# Patient Record
Sex: Male | Born: 1950 | Race: White | Hispanic: No | Marital: Married | Smoking: Never smoker
Health system: Southern US, Community
[De-identification: ages and names within clinical notes are randomized; demographics above are authoritative.]

## PROBLEM LIST (undated history)

## (undated) DIAGNOSIS — R569 Unspecified convulsions: Secondary | ICD-10-CM

## (undated) DIAGNOSIS — I10 Essential (primary) hypertension: Secondary | ICD-10-CM

## (undated) DIAGNOSIS — R011 Cardiac murmur, unspecified: Secondary | ICD-10-CM

## (undated) DIAGNOSIS — R7303 Prediabetes: Secondary | ICD-10-CM

## (undated) DIAGNOSIS — E785 Hyperlipidemia, unspecified: Secondary | ICD-10-CM

## (undated) DIAGNOSIS — K219 Gastro-esophageal reflux disease without esophagitis: Secondary | ICD-10-CM

## (undated) DIAGNOSIS — E119 Type 2 diabetes mellitus without complications: Secondary | ICD-10-CM

## (undated) DIAGNOSIS — M1990 Unspecified osteoarthritis, unspecified site: Secondary | ICD-10-CM

## (undated) HISTORY — DX: Type 2 diabetes mellitus without complications: E11.9

## (undated) HISTORY — PX: TONSILLECTOMY: SUR1361

## (undated) HISTORY — PX: JOINT REPLACEMENT: SHX530

## (undated) HISTORY — PX: COLONOSCOPY: SHX174

---

## 2004-10-13 ENCOUNTER — Ambulatory Visit: Payer: Self-pay | Admitting: Gastroenterology

## 2017-03-20 ENCOUNTER — Other Ambulatory Visit: Payer: Self-pay | Admitting: Orthopedic Surgery

## 2017-03-20 DIAGNOSIS — M170 Bilateral primary osteoarthritis of knee: Secondary | ICD-10-CM

## 2017-03-21 ENCOUNTER — Ambulatory Visit
Admission: RE | Admit: 2017-03-21 | Discharge: 2017-03-21 | Disposition: A | Discharge status: Home / Self Care | Payer: Medicare Other | Source: Ambulatory Visit | Attending: Orthopedic Surgery | Admitting: Orthopedic Surgery

## 2017-03-21 DIAGNOSIS — M170 Bilateral primary osteoarthritis of knee: Secondary | ICD-10-CM | POA: Insufficient documentation

## 2017-04-19 ENCOUNTER — Other Ambulatory Visit: Payer: Medicare Other

## 2017-04-25 ENCOUNTER — Encounter: Admission: RE | Payer: Self-pay | Source: Ambulatory Visit

## 2017-04-25 ENCOUNTER — Inpatient Hospital Stay: Admission: RE | Admit: 2017-04-25 | Payer: Medicare Other | Source: Ambulatory Visit | Admitting: Orthopedic Surgery

## 2017-04-25 SURGERY — ARTHROPLASTY, KNEE, TOTAL
Anesthesia: Choice | Laterality: Left

## 2017-06-07 HISTORY — PX: TOTAL KNEE ARTHROPLASTY: SHX125

## 2017-06-16 DIAGNOSIS — M1711 Unilateral primary osteoarthritis, right knee: Secondary | ICD-10-CM

## 2017-06-16 DIAGNOSIS — I10 Essential (primary) hypertension: Secondary | ICD-10-CM | POA: Diagnosis not present

## 2017-06-16 DIAGNOSIS — R7303 Prediabetes: Secondary | ICD-10-CM | POA: Diagnosis not present

## 2017-06-16 DIAGNOSIS — I2510 Atherosclerotic heart disease of native coronary artery without angina pectoris: Secondary | ICD-10-CM | POA: Diagnosis not present

## 2017-10-10 ENCOUNTER — Ambulatory Visit
Admission: RE | Admit: 2017-10-10 | Discharge: 2017-10-10 | Disposition: A | Discharge status: Home / Self Care | Payer: Medicare Other | Source: Ambulatory Visit | Attending: Physical Medicine & Rehabilitation | Admitting: Physical Medicine & Rehabilitation

## 2017-10-10 ENCOUNTER — Other Ambulatory Visit: Payer: Self-pay | Admitting: Physical Medicine & Rehabilitation

## 2017-10-10 DIAGNOSIS — E230 Hypopituitarism: Secondary | ICD-10-CM

## 2019-01-15 ENCOUNTER — Other Ambulatory Visit: Payer: Self-pay

## 2019-01-15 ENCOUNTER — Encounter: Payer: Self-pay | Admitting: *Deleted

## 2019-01-17 NOTE — Discharge Instructions (Signed)

## 2019-01-23 ENCOUNTER — Encounter
Admission: RE | Disposition: A | Discharge status: Home / Self Care | Payer: Self-pay | Source: Home / Self Care | Attending: Ophthalmology

## 2019-01-23 ENCOUNTER — Ambulatory Visit
Admission: RE | Admit: 2019-01-23 | Discharge: 2019-01-23 | Disposition: A | Discharge status: Home / Self Care | Payer: Medicare Other | Attending: Ophthalmology | Admitting: Ophthalmology

## 2019-01-23 ENCOUNTER — Ambulatory Visit: Payer: Medicare Other | Admitting: Anesthesiology

## 2019-01-23 ENCOUNTER — Other Ambulatory Visit: Payer: Self-pay

## 2019-01-23 DIAGNOSIS — Z791 Long term (current) use of non-steroidal anti-inflammatories (NSAID): Secondary | ICD-10-CM | POA: Insufficient documentation

## 2019-01-23 DIAGNOSIS — Z7984 Long term (current) use of oral hypoglycemic drugs: Secondary | ICD-10-CM | POA: Diagnosis not present

## 2019-01-23 DIAGNOSIS — E7800 Pure hypercholesterolemia, unspecified: Secondary | ICD-10-CM | POA: Diagnosis not present

## 2019-01-23 DIAGNOSIS — H2512 Age-related nuclear cataract, left eye: Secondary | ICD-10-CM | POA: Diagnosis present

## 2019-01-23 DIAGNOSIS — Z79899 Other long term (current) drug therapy: Secondary | ICD-10-CM | POA: Insufficient documentation

## 2019-01-23 DIAGNOSIS — E1136 Type 2 diabetes mellitus with diabetic cataract: Secondary | ICD-10-CM | POA: Insufficient documentation

## 2019-01-23 DIAGNOSIS — Z7982 Long term (current) use of aspirin: Secondary | ICD-10-CM | POA: Insufficient documentation

## 2019-01-23 DIAGNOSIS — Z96659 Presence of unspecified artificial knee joint: Secondary | ICD-10-CM | POA: Diagnosis not present

## 2019-01-23 DIAGNOSIS — I10 Essential (primary) hypertension: Secondary | ICD-10-CM | POA: Insufficient documentation

## 2019-01-23 HISTORY — DX: Essential (primary) hypertension: I10

## 2019-01-23 HISTORY — DX: Cardiac murmur, unspecified: R01.1

## 2019-01-23 HISTORY — DX: Gastro-esophageal reflux disease without esophagitis: K21.9

## 2019-01-23 HISTORY — DX: Hyperlipidemia, unspecified: E78.5

## 2019-01-23 HISTORY — PX: CATARACT EXTRACTION W/PHACO: SHX586

## 2019-01-23 HISTORY — DX: Prediabetes: R73.03

## 2019-01-23 HISTORY — DX: Unspecified osteoarthritis, unspecified site: M19.90

## 2019-01-23 LAB — GLUCOSE, CAPILLARY
Glucose-Capillary: 113 mg/dL — ABNORMAL HIGH (ref 70–99)
Glucose-Capillary: 111 mg/dL — ABNORMAL HIGH (ref 70–99)

## 2019-01-23 SURGERY — PHACOEMULSIFICATION, CATARACT, WITH IOL INSERTION
Anesthesia: Monitor Anesthesia Care | Site: Eye | Laterality: Left

## 2019-01-23 MED ORDER — MIDAZOLAM HCL 2 MG/2ML IJ SOLN
INTRAMUSCULAR | Status: DC | PRN
  Administered 2019-01-23: 2 mg via INTRAVENOUS

## 2019-01-23 MED ORDER — LIDOCAINE HCL (PF) 2 % IJ SOLN
INTRAOCULAR | Status: DC | PRN
  Administered 2019-01-23: 1 mL

## 2019-01-23 MED ORDER — MOXIFLOXACIN HCL 0.5 % OP SOLN
1.0000 [drp] | OPHTHALMIC | Status: DC | PRN
  Administered 2019-01-23 (×3): 1 [drp] via OPHTHALMIC

## 2019-01-23 MED ORDER — LACTATED RINGERS IV SOLN: 10.0000 mL/h | INTRAVENOUS | Status: DC

## 2019-01-23 MED ORDER — CEFUROXIME OPHTHALMIC INJECTION 1 MG/0.1 ML
INJECTION | OPHTHALMIC | Status: DC | PRN
  Administered 2019-01-23: 0.1 mL via INTRACAMERAL

## 2019-01-23 MED ORDER — EPINEPHRINE PF 1 MG/ML IJ SOLN
INTRAOCULAR | Status: DC | PRN
  Administered 2019-01-23: 72 mL via OPHTHALMIC

## 2019-01-23 MED ORDER — ARMC OPHTHALMIC DILATING DROPS
1.0000 "application " | OPHTHALMIC | Status: DC | PRN
  Administered 2019-01-23 (×3): 1 via OPHTHALMIC

## 2019-01-23 MED ORDER — ONDANSETRON HCL 4 MG/2ML IJ SOLN: 4.0000 mg | Freq: Once | INTRAMUSCULAR | Status: DC | PRN

## 2019-01-23 MED ORDER — NA HYALUR & NA CHOND-NA HYALUR 0.4-0.35 ML IO KIT
PACK | INTRAOCULAR | Status: DC | PRN
  Administered 2019-01-23: 1 mL via INTRAOCULAR

## 2019-01-23 MED ORDER — TETRACAINE HCL 0.5 % OP SOLN
1.0000 [drp] | OPHTHALMIC | Status: DC | PRN
  Administered 2019-01-23 (×3): 1 [drp] via OPHTHALMIC

## 2019-01-23 MED ORDER — FENTANYL CITRATE (PF) 100 MCG/2ML IJ SOLN
INTRAMUSCULAR | Status: DC | PRN
  Administered 2019-01-23: 100 ug via INTRAVENOUS

## 2019-01-23 MED ORDER — BRIMONIDINE TARTRATE-TIMOLOL 0.2-0.5 % OP SOLN
OPHTHALMIC | Status: DC | PRN
  Administered 2019-01-23: 1 [drp] via OPHTHALMIC

## 2019-01-23 SURGICAL SUPPLY — 30 items
CANNULA ANT/CHMB 27G (MISCELLANEOUS) ×1 IMPLANT
CANNULA ANT/CHMB 27GA (MISCELLANEOUS) ×3 IMPLANT
GLOVE BIO SURGEON STRL SZ8 (GLOVE) ×3 IMPLANT
GLOVE SURG LX 7.5 STRW (GLOVE) ×2
GLOVE SURG LX STRL 7.5 STRW (GLOVE) ×1 IMPLANT
GLOVE SURG TRIUMPH 8.0 PF LTX (GLOVE) ×3 IMPLANT
GOWN STRL REUS W/ TWL LRG LVL3 (GOWN DISPOSABLE) ×2 IMPLANT
GOWN STRL REUS W/TWL LRG LVL3 (GOWN DISPOSABLE) ×4
LENS IOL TECNIS ITEC 19.5 (Intraocular Lens) ×2 IMPLANT
MARKER SKIN DUAL TIP RULER LAB (MISCELLANEOUS) ×3 IMPLANT
NDL FILTER BLUNT 18X1 1/2 (NEEDLE) ×1 IMPLANT
NDL RETROBULBAR .5 NSTRL (NEEDLE) IMPLANT
NEEDLE FILTER BLUNT 18X 1/2SAF (NEEDLE) ×2
NEEDLE FILTER BLUNT 18X1 1/2 (NEEDLE) ×1 IMPLANT
PACK CATARACT (MISCELLANEOUS) ×3 IMPLANT
PACK CATARACT BRASINGTON (MISCELLANEOUS) ×3 IMPLANT
PACK CATARACT BRASINGTON LX (MISCELLANEOUS) ×3 IMPLANT
PACK EYE AFTER SURG (MISCELLANEOUS) ×3 IMPLANT
PACK OPTHALMIC (MISCELLANEOUS) ×3 IMPLANT
RING MALYGIN 7.0 (MISCELLANEOUS) IMPLANT
SUT ETHILON 10-0 CS-B-6CS-B-6 (SUTURE)
SUT VICRYL  9 0 (SUTURE)
SUT VICRYL 9 0 (SUTURE) IMPLANT
SUTURE EHLN 10-0 CS-B-6CS-B-6 (SUTURE) IMPLANT
SYR 3ML LL SCALE MARK (SYRINGE) ×3 IMPLANT
SYR 5ML LL (SYRINGE) ×3 IMPLANT
SYR TB 1ML LUER SLIP (SYRINGE) ×3 IMPLANT
WATER STERILE IRR 250ML POUR (IV SOLUTION) ×3 IMPLANT
WATER STERILE IRR 500ML POUR (IV SOLUTION) ×3 IMPLANT
WIPE NON LINTING 3.25X3.25 (MISCELLANEOUS) ×3 IMPLANT

## 2019-01-23 NOTE — Transfer of Care (Signed)
Immediate Anesthesia Transfer of Care Note  Patient: Norman Castillo  Procedure(s) Performed: CATARACT EXTRACTION PHACO AND INTRAOCULAR LENS PLACEMENT (IOC) LEFT AND DIABETIC (Left Eye)  Patient Location: PACU  Anesthesia Type: MAC  Level of Consciousness: awake, alert  and patient cooperative  Airway and Oxygen Therapy: Patient Spontanous Breathing and Patient connected to supplemental oxygen  Post-op Assessment: Post-op Vital signs reviewed, Patient's Cardiovascular Status Stable, Respiratory Function Stable, Patent Airway and No signs of Nausea or vomiting  Post-op Vital Signs: Reviewed and stable  Complications: No apparent anesthesia complications

## 2019-01-23 NOTE — Anesthesia Postprocedure Evaluation (Signed)
Anesthesia Post Note  Patient: Norman Castillo  Procedure(s) Performed: CATARACT EXTRACTION PHACO AND INTRAOCULAR LENS PLACEMENT (IOC) LEFT AND DIABETIC (Left Eye)  Patient location during evaluation: PACU Anesthesia Type: MAC Level of consciousness: awake and alert and oriented Pain management: satisfactory to patient Vital Signs Assessment: post-procedure vital signs reviewed and stable Respiratory status: spontaneous breathing, nonlabored ventilation and respiratory function stable Cardiovascular status: blood pressure returned to baseline and stable Postop Assessment: Adequate PO intake and No signs of nausea or vomiting Anesthetic complications: no    Raliegh Ip

## 2019-01-23 NOTE — Anesthesia Procedure Notes (Signed)
Procedure Name: MAC Performed by: Izetta Dakin, CRNA Pre-anesthesia Checklist: Timeout performed, Patient being monitored, Suction available, Emergency Drugs available and Patient identified Patient Re-evaluated:Patient Re-evaluated prior to induction Oxygen Delivery Method: Nasal cannula

## 2019-01-23 NOTE — H&P (Signed)

## 2019-01-23 NOTE — Op Note (Signed)
OPERATIVE NOTE  Norman Castillo [id number] 01/23/2019   PREOPERATIVE DIAGNOSIS:  Nuclear sclerotic cataract left eye. H25.12   POSTOPERATIVE DIAGNOSIS:    Nuclear sclerotic cataract left eye.     PROCEDURE:  Phacoemusification with posterior chamber intraocular lens placement of the left eye   LENS:   Implant Name Type Inv. Item Serial No. Manufacturer Lot No. LRB No. Used  LENS IOL DIOP 19.5 - [id number] Intraocular Lens LENS IOL DIOP 19.5 [id number]  Left 1        ULTRASOUND TIME: 19  % of 1 minutes 16 seconds, CDE 14.4  SURGEON:  Wyonia Hough, MD   ANESTHESIA:  Topical with tetracaine drops and 2% Xylocaine jelly, augmented with 1% preservative-free intracameral lidocaine.    COMPLICATIONS:  None.   DESCRIPTION OF PROCEDURE:  The patient was identified in the holding room and transported to the operating room and placed in the supine position under the operating microscope.  The left eye was identified as the operative eye and it was prepped and draped in the usual sterile ophthalmic fashion.   A 1 millimeter clear-corneal paracentesis was made at the 1:30 position.  0.5 ml of preservative-free 1% lidocaine was injected into the anterior chamber.  The anterior chamber was filled with Viscoat viscoelastic.  A 2.4 millimeter keratome was used to make a near-clear corneal incision at the 10:30 position.  .  A curvilinear capsulorrhexis was made with a cystotome and capsulorrhexis forceps.  Balanced salt solution was used to hydrodissect and hydrodelineate the nucleus.   Phacoemulsification was then used in stop and chop fashion to remove the lens nucleus and epinucleus.  The remaining cortex was then removed using the irrigation and aspiration handpiece. Provisc was then placed into the capsular bag to distend it for lens placement.  A lens was then injected into the capsular bag.  The remaining viscoelastic was aspirated.   Wounds were hydrated with balanced salt  solution.  The anterior chamber was inflated to a physiologic pressure with balanced salt solution.  No wound leaks were noted. Cefuroxime 0.1 ml of a 10mg /ml solution was injected into the anterior chamber for a dose of 1 mg of intracameral antibiotic at the completion of the case.   Timolol and Brimonidine drops were applied to the eye.  The patient was taken to the recovery room in stable condition without complications of anesthesia or surgery.  [name] 01/23/2019, 8:36 AM

## 2019-01-23 NOTE — Anesthesia Preprocedure Evaluation (Signed)
Anesthesia Evaluation  Patient identified by MRN, date of birth, ID band Patient awake    Reviewed: Allergy & Precautions, H&P , NPO status , Patient's Chart, lab work & pertinent test results  Airway Mallampati: II  TM Distance: >3 FB Neck ROM: full    Dental no notable dental hx.    Pulmonary    Pulmonary exam normal breath sounds clear to auscultation       Cardiovascular hypertension, Normal cardiovascular exam Rhythm:regular Rate:Normal     Neuro/Psych    GI/Hepatic GERD  ,  Endo/Other    Renal/GU      Musculoskeletal   Abdominal   Peds  Hematology   Anesthesia Other Findings   Reproductive/Obstetrics                             Anesthesia Physical Anesthesia Plan  ASA: II  Anesthesia Plan: MAC   Post-op Pain Management:    Induction:   PONV Risk Score and Plan: 1 and Midazolam and Treatment may vary due to age or medical condition  Airway Management Planned:   Additional Equipment:   Intra-op Plan:   Post-operative Plan:   Informed Consent: I have reviewed the patients History and Physical, chart, labs and discussed the procedure including the risks, benefits and alternatives for the proposed anesthesia with the patient or authorized representative who has indicated his/her understanding and acceptance.       Plan Discussed with: CRNA  Anesthesia Plan Comments:         Anesthesia Quick Evaluation

## 2019-03-22 ENCOUNTER — Other Ambulatory Visit: Admission: RE | Admit: 2019-03-22 | Payer: Medicare Other | Source: Ambulatory Visit

## 2019-03-22 NOTE — Discharge Instructions (Signed)
Cataract Surgery, Care After °This sheet gives you information about how to care for yourself after your procedure. Your health care provider may also give you more specific instructions. If you have problems or questions, contact your health care provider. °What can I expect after the procedure? °After the procedure, it is common to have: °· Itching. °· Discomfort. °· Fluid discharge. °· Sensitivity to light and to touch. °· Bruising in or around the eye. °· Mild blurred vision. °Follow these instructions at home: °Eye care ° °· Do not touch or rub your eyes. °· Protect your eyes as told by your health care provider. You may be told to wear a protective eye shield or sunglasses. °· Do not put a contact lens into the affected eye or eyes until your health care provider approves. °· Keep the area around your eye clean and dry: °? Avoid swimming. °? Do not allow water to hit you directly in the face while showering. °? Keep soap and shampoo out of your eyes. °· Check your eye every day for signs of infection. Watch for: °? Redness, swelling, or pain. °? Fluid, blood, or pus. °? Warmth. °? A bad smell. °? Vision that is getting worse. °? Sensitivity that is getting worse. °Activity °· Do not drive for 24 hours if you were given a sedative during your procedure. °· Avoid strenuous activities, such as playing contact sports, for as long as told by your health care provider. °· Do not drive or use heavy machinery until your health care provider approves. °· Do not bend or lift heavy objects. Bending increases pressure in the eye. You can walk, climb stairs, and do light household chores. °· Ask your health care provider when you can return to work. If you work in a dusty environment, you may be advised to wear protective eyewear for a period of time. °General instructions °· Take or apply over-the-counter and prescription medicines only as told by your health care provider. This includes eye drops. °· Keep all follow-up  visits as told by your health care provider. This is important. °Contact a health care provider if: °· You have increased bruising around your eye. °· You have pain that is not helped with medicine. °· You have a fever. °· You have redness, swelling, or pain in your eye. °· You have fluid, blood, or pus coming from your incision. °· Your vision gets worse. °· Your sensitivity to light gets worse. °Get help right away if: °· You have sudden loss of vision. °· You see flashes of light or spots (floaters). °· You have severe eye pain. °· You develop nausea or vomiting. °Summary °· After your procedure, it is common to have itching, discomfort, bruising, fluid discharge, or sensitivity to light. °· Follow instructions from your health care provider about caring for your eye after the procedure. °· Do not rub your eye after the procedure. You may need to wear eye protection or sunglasses. Do not wear contact lenses. Keep the area around your eye clean and dry. °· Avoid activities that require a lot of effort. These include playing sports and lifting heavy objects. °· Contact a health care provider if you have increased bruising, pain that does not go away, or a fever. Get help right away if you suddenly lose your vision, see flashes of light or spots, or have severe pain in the eye. °This information is not intended to replace advice given to you by your health care provider. Make sure you discuss   any questions you have with your health care provider. Document Released: 05/13/2005 Document Revised: 04/23/2018 Document Reviewed: 04/23/2018 Elsevier Interactive Patient Education  2019 [place] Anesthesia, Adult General anesthesia is the use of medicines to make a person "go to sleep" (unconscious) for a medical procedure. General anesthesia must be used for certain procedures, and is often recommended for procedures that:  Last a long time.  Require you to be still or in an unusual position.  Are  major and can cause blood loss. The medicines used for general anesthesia are called general anesthetics. As well as making you unconscious for a certain amount of time, these medicines:  Prevent pain.  Control your blood pressure.  Relax your muscles. Tell a health care provider about:  Any allergies you have.  All medicines you are taking, including vitamins, herbs, eye drops, creams, and over-the-counter medicines.  Any problems you or family members have had with anesthetic medicines.  Types of anesthetics you have had in the past.  Any blood disorders you have.  Any surgeries you have had.  Any medical conditions you have.  Any recent upper respiratory, chest, or ear infections.  Any history of: ? Heart or lung conditions, such as heart failure, sleep apnea, asthma, or chronic obstructive pulmonary disease (COPD). ? Armed forces logistics/support/administrative officer. ? Depression or anxiety.  Any tobacco or drug use, including marijuana or alcohol use.  Whether you are pregnant or may be pregnant. What are the risks? Generally, this is a safe procedure. However, problems may occur, including:  Allergic reaction.  Lung and heart problems.  Inhaling food or liquid from the stomach into the lungs (aspiration).  Nerve injury.  Dental injury.  Air in the bloodstream, which can lead to stroke.  Extreme agitation or confusion (delirium) when you wake up from the anesthetic.  Waking up during your procedure and being unable to move. This is rare. These problems are more likely to develop if you are having a major surgery or if you have an advanced or serious medical condition. You can prevent some of these complications by answering all of your health care provider's questions thoroughly and by following all instructions before your procedure. General anesthesia can cause side effects, including:  Nausea or vomiting.  A sore throat from the breathing tube.  Hoarseness.  Wheezing or  coughing.  Shaking chills.  Tiredness.  Body aches.  Anxiety.  Sleepiness or drowsiness.  Confusion or agitation. What happens before the procedure? Staying hydrated Follow instructions from your health care provider about hydration, which may include:  Up to 2 hours before the procedure - you may continue to drink clear liquids, such as water, clear fruit juice, black coffee, and plain tea.  Eating and drinking restrictions Follow instructions from your health care provider about eating and drinking, which may include:  8 hours before the procedure - stop eating heavy meals or foods such as meat, fried foods, or fatty foods.  6 hours before the procedure - stop eating light meals or foods, such as toast or cereal.  6 hours before the procedure - stop drinking milk or drinks that contain milk.  2 hours before the procedure - stop drinking clear liquids. Medicines Ask your health care provider about:  Changing or stopping your regular medicines. This is especially important if you are taking diabetes medicines or blood thinners.  Taking medicines such as aspirin and ibuprofen. These medicines can thin your blood. Do not take these medicines unless your health care provider  tells you to take them.  Taking over-the-counter medicines, vitamins, herbs, and supplements. Do not take these during the week before your procedure unless your health care provider approves them. General instructions  Starting 3-6 weeks before the procedure, do not use any products that contain nicotine or tobacco, such as cigarettes and e-cigarettes. If you need help quitting, ask your health care provider.  If you brush your teeth on the morning of the procedure, make sure to spit out all of the toothpaste.  Tell your health care provider if you become ill or develop a cold, cough, or fever.  If instructed by your health care provider, bring your sleep apnea device with you on the day of your surgery  (if applicable).  Ask your health care provider if you will be going home the same day, the following day, or after a longer hospital stay. ? Plan to have someone take you home from the hospital or clinic. ? Plan to have a responsible adult care for you for at least 24 hours after you leave the hospital or clinic. This is important. What happens during the procedure?   You will be given anesthetics through both of the following: ? A mask placed over your nose and mouth. ? An IV in one of your veins.  You may receive a medicine to help you relax (sedative).  After you are unconscious, a breathing tube may be inserted down your throat to help you breathe. This will be removed before you wake up.  An anesthesia specialist will stay with you throughout your procedure. He or she will: ? Keep you comfortable and safe by continuing to give you medicines and adjusting the amount of medicine that you get. ? Monitor your blood pressure, pulse, and oxygen levels to make sure that the anesthetics do not cause any problems. The procedure may vary among health care providers and hospitals. What happens after the procedure?  Your blood pressure, temperature, heart rate, breathing rate, and blood oxygen level will be monitored until the medicines you were given have worn off.  You will wake up in a recovery area. You may wake up slowly.  If you feel anxious or agitated, you may be given medicine to help you calm down.  If you will be going home the same day, your health care provider may check to make sure you can walk, drink, and urinate.  Your health care provider will treat any pain or side effects you have before you go home.  Do not drive for 24 hours if you were given a sedative. Summary  General anesthesia is used to keep you still and prevent pain during a procedure.  It is important to tell your health care provider about your medical history and any surgeries you have had, and previous  experience with anesthesia.  Follow your health care provider's instructions about when to stop eating, drinking, or taking certain medicines before your procedure.  Plan to have someone take you home from the hospital or clinic. This information is not intended to replace advice given to you by your health care provider. Make sure you discuss any questions you have with your health care provider. Document Released: 01/31/2008 Document Revised: 03/13/2018 Document Reviewed: 06/09/2017 Elsevier Interactive Patient Education  2019 [employer].

## 2019-03-25 ENCOUNTER — Other Ambulatory Visit: Admission: RE | Admit: 2019-03-25 | Payer: Medicare Other | Source: Ambulatory Visit

## 2019-03-27 ENCOUNTER — Ambulatory Visit: Admission: RE | Admit: 2019-03-27 | Payer: Medicare Other | Source: Home / Self Care | Admitting: Ophthalmology

## 2019-03-27 ENCOUNTER — Encounter: Admission: RE | Payer: Self-pay | Source: Home / Self Care

## 2019-03-27 SURGERY — PHACOEMULSIFICATION, CATARACT, WITH IOL INSERTION
Anesthesia: Topical | Laterality: Right

## 2019-11-28 ENCOUNTER — Ambulatory Visit: Payer: Medicare Other | Attending: Internal Medicine

## 2019-11-28 DIAGNOSIS — Z23 Encounter for immunization: Secondary | ICD-10-CM

## 2019-11-28 NOTE — Progress Notes (Signed)
   Covid-19 Vaccination Clinic  Name:  Norman Castillo    MRN: [id number] DOB: August 17, 1951  11/28/2019  [name] was observed post Covid-19 immunization for 15 minutes without incidence. He was provided with Vaccine Information Sheet and instruction to access the V-Safe system.   [name] was instructed to call 911 with any severe reactions post vaccine: Marland Kitchen Difficulty breathing  . Swelling of your face and throat  . A fast heartbeat  . A bad rash all over your body  . Dizziness and weakness    Immunizations Administered    Name Date Dose VIS Date Route   Pfizer COVID-19 Vaccine 11/28/2019 11:18 AM 0.3 mL 10/18/2019 Intramuscular   Manufacturer: [employer]   Lot: [id number]   [place]: [id number]

## 2019-12-19 ENCOUNTER — Ambulatory Visit: Payer: Medicare Other | Attending: Internal Medicine

## 2019-12-19 DIAGNOSIS — Z23 Encounter for immunization: Secondary | ICD-10-CM | POA: Insufficient documentation

## 2019-12-19 NOTE — Progress Notes (Signed)
   Covid-19 Vaccination Clinic  Name:  Norman Castillo    MRN: [id number] DOB: November 30, 1950  12/19/2019  [name] was observed post Covid-19 immunization for 15 minutes without incidence. He was provided with Vaccine Information Sheet and instruction to access the V-Safe system.   [name] was instructed to call 911 with any severe reactions post vaccine: Marland Kitchen Difficulty breathing  . Swelling of your face and throat  . A fast heartbeat  . A bad rash all over your body  . Dizziness and weakness    Immunizations Administered    Name Date Dose VIS Date Route   Pfizer COVID-19 Vaccine 12/19/2019 11:13 AM 0.3 mL 10/18/2019 Intramuscular   Manufacturer: [employer]   Lot: [id number]   [place]: [id number]

## 2020-10-21 ENCOUNTER — Other Ambulatory Visit: Payer: Self-pay

## 2020-10-21 ENCOUNTER — Emergency Department
Admission: EM | Admit: 2020-10-21 | Discharge: 2020-10-21 | Disposition: A | Discharge status: Home / Self Care | Payer: Medicare Other | Attending: Emergency Medicine | Admitting: Emergency Medicine

## 2020-10-21 ENCOUNTER — Emergency Department: Payer: Medicare Other

## 2020-10-21 DIAGNOSIS — I10 Essential (primary) hypertension: Secondary | ICD-10-CM | POA: Insufficient documentation

## 2020-10-21 DIAGNOSIS — R569 Unspecified convulsions: Secondary | ICD-10-CM | POA: Insufficient documentation

## 2020-10-21 DIAGNOSIS — Z7982 Long term (current) use of aspirin: Secondary | ICD-10-CM | POA: Insufficient documentation

## 2020-10-21 DIAGNOSIS — R410 Disorientation, unspecified: Secondary | ICD-10-CM | POA: Diagnosis not present

## 2020-10-21 DIAGNOSIS — R7303 Prediabetes: Secondary | ICD-10-CM | POA: Diagnosis not present

## 2020-10-21 DIAGNOSIS — Z96652 Presence of left artificial knee joint: Secondary | ICD-10-CM | POA: Insufficient documentation

## 2020-10-21 DIAGNOSIS — Z20822 Contact with and (suspected) exposure to covid-19: Secondary | ICD-10-CM | POA: Insufficient documentation

## 2020-10-21 DIAGNOSIS — Z7984 Long term (current) use of oral hypoglycemic drugs: Secondary | ICD-10-CM | POA: Diagnosis not present

## 2020-10-21 DIAGNOSIS — Z79899 Other long term (current) drug therapy: Secondary | ICD-10-CM | POA: Diagnosis not present

## 2020-10-21 DIAGNOSIS — R55 Syncope and collapse: Secondary | ICD-10-CM | POA: Insufficient documentation

## 2020-10-21 DIAGNOSIS — R4020 Unspecified coma: Secondary | ICD-10-CM

## 2020-10-21 LAB — COMPREHENSIVE METABOLIC PANEL
ALT: 38 U/L (ref 0–44)
AST: 42 U/L — ABNORMAL HIGH (ref 15–41)
Albumin: 4.1 g/dL (ref 3.5–5.0)
Alkaline Phosphatase: 57 U/L (ref 38–126)
Anion gap: 11 (ref 5–15)
BUN: 17 mg/dL (ref 8–23)
CO2: 22 mmol/L (ref 22–32)
Calcium: 9.2 mg/dL (ref 8.9–10.3)
Chloride: 102 mmol/L (ref 98–111)
Creatinine, Ser: 1.1 mg/dL (ref 0.61–1.24)
GFR, Estimated: >60 mL/min (ref >60)
Glucose, Bld: 188 mg/dL — ABNORMAL HIGH (ref 70–99)
Potassium: 3.9 mmol/L (ref 3.5–5.1)
Sodium: 135 mmol/L (ref 135–145)
Total Bilirubin: 0.5 mg/dL (ref 0.3–1.2)
Total Protein: 7.2 g/dL (ref 6.5–8.1)

## 2020-10-21 LAB — CBC
HCT: 36.5 % — ABNORMAL LOW (ref 39.0–52.0)
Hemoglobin: 12.4 g/dL — ABNORMAL LOW (ref 13.0–17.0)
MCH: 30.8 pg (ref 26.0–34.0)
MCHC: 34 g/dL (ref 30.0–36.0)
MCV: 90.8 fL (ref 80.0–100.0)
Platelets: 214 10*3/uL (ref 150–400)
RBC: 4.02 MIL/uL — ABNORMAL LOW (ref 4.22–5.81)
RDW: 12.8 % (ref 11.5–15.5)
WBC: 4.8 10*3/uL (ref 4.0–10.5)
nRBC: 0 % (ref 0.0–0.2)

## 2020-10-21 LAB — TROPONIN I (HIGH SENSITIVITY)
Troponin I (High Sensitivity): 10 ng/L (ref <18)
Troponin I (High Sensitivity): 13 ng/L (ref <18)

## 2020-10-21 LAB — RESP PANEL BY RT-PCR (FLU A&B, COVID) ARPGX2
Influenza A by PCR: NEGATIVE
Influenza B by PCR: NEGATIVE
SARS Coronavirus 2 by RT PCR: NEGATIVE

## 2020-10-21 MED ORDER — ONDANSETRON HCL 4 MG/2ML IJ SOLN
4.0000 mg | Freq: Once | INTRAMUSCULAR | Status: AC
  Administered 2020-10-21: 4 mg via INTRAVENOUS

## 2020-10-21 NOTE — Discharge Instructions (Signed)
As we discussed please follow-up with your primary care doctor in the next 1 to 2 days for recheck/reevaluation.  Please discuss further possible Holter monitor as well as possibility of a seizure.  Please slowly limit your alcohol intake.  Do not drive until you have been cleared by your doctor to do so.  Return to the emergency department for any further seizure-like activity, if you pass out, or any other symptom personally concerning to yourself.

## 2020-10-21 NOTE — ED Provider Notes (Signed)
[facility] Emergency Department Provider Note  Time seen: 12:20 PM  I have reviewed the triage vital signs and the nursing notes.   HISTORY  Chief Complaint Fall and Altered Mental Status   HPI Norman Castillo is a 69 y.o. male with a past medical history of arthritis, gastric reflux, hypertension, hyperlipidemia, presents to the emergency department after a fall/syncopal episode.  According to the patient and EMS report patient's wife heard a thud in the bathroom and found the patient unresponsive in the shower.  Called EMS, EMS states upon arrival patient is awake and alert but seemed confused or disoriented.   States the altered mental status lasted approximately 20 minutes and largely resolved upon arrival to the emergency department.  EMS has rhythm strips which appear to show a tachycardic rhythm as well as normal sinus rhythm.  Patient does not believe he has ever had tachycardia or atrial fibrillation in the past.  Denies any chest pain shortness of breath, nausea vomiting diarrhea dysuria fever.  States he had been feeling well up until this morning.  Does state this morning he felt somewhat dizzy before getting in the shower.  Past Medical History:  Diagnosis Date  . Arthritis   . GERD (gastroesophageal reflux disease)   . Heart murmur   . Hyperlipidemia   . Hypertension   . Pre-diabetes     There are no problems to display for this patient.   Past Surgical History:  Procedure Laterality Date  . CATARACT EXTRACTION W/PHACO Left 01/23/2019   Procedure: CATARACT EXTRACTION PHACO AND INTRAOCULAR LENS PLACEMENT ([place]) LEFT AND DIABETIC;  Surgeon: Leandrew Koyanagi, MD;  Location: [place];  Service: Ophthalmology;  Laterality: Left;  . COLONOSCOPY    . JOINT REPLACEMENT    . TONSILLECTOMY     childhood  . TOTAL KNEE ARTHROPLASTY Left 06/2017   [facility]    Prior to Admission medications   Medication Sig Start Date End Date Taking?  Authorizing Provider  allopurinol (ZYLOPRIM) 100 MG tablet Take 100 mg by mouth daily.    [provider]  Ascorbic Acid (VITAMIN C PO) Take by mouth daily.    [provider]  aspirin 81 MG tablet Take 81 mg by mouth daily.    [provider]  Fluticasone Propionate (FLONASE ALLERGY RELIEF NA) Place into the nose daily as needed.    [provider]  gemfibrozil (LOPID) 600 MG tablet Take 600 mg by mouth 2 (two) times daily before a meal.    [provider]  meloxicam (MOBIC) 15 MG tablet Take 15 mg by mouth daily.    [provider]  metFORMIN (GLUCOPHAGE) 500 MG tablet Take 500 mg by mouth 2 (two) times daily with a meal.    [provider]  metoprolol succinate (TOPROL-XL) 50 MG 24 hr tablet Take 50 mg by mouth 2 (two) times daily. Take with or immediately following a meal.    [provider]  Multiple Vitamin (MULTIVITAMIN) tablet Take 1 tablet by mouth daily.    [provider]  omeprazole (PRILOSEC) 40 MG capsule Take 40 mg by mouth daily.    [provider]  pravastatin (PRAVACHOL) 40 MG tablet Take 40 mg by mouth 2 (two) times daily.    [provider]  terazosin (HYTRIN) 5 MG capsule Take 5 mg by mouth daily.    [provider]    No Known Allergies  History reviewed. No pertinent family history.  Social History Social  History   Tobacco Use  . Smoking status: Never Smoker  . Smokeless tobacco: Never Used  Vaping Use  . Vaping Use: Never used  Substance Use Topics  . Alcohol use: Yes    Alcohol/week: 28.0 standard drinks    Types: 28 Shots of liquor per week    Review of Systems Constitutional: Negative for fever. Cardiovascular: Negative for chest pain. Respiratory: Negative for shortness of breath. Gastrointestinal: Negative for abdominal pain, vomiting and diarrhea. Genitourinary: Negative for urinary compaints Musculoskeletal: Negative for musculoskeletal  complaints Skin: Negative for skin complaints  Neurological: Negative for headache All other ROS negative  ____________________________________________   PHYSICAL EXAM:  Constitutional: Alert and oriented. Well appearing and in no distress. Eyes: Normal exam ENT      Head: Normocephalic and atraumatic.      Mouth/Throat: Mucous membranes are moist. Cardiovascular: Normal rate, regular rhythm. Respiratory: Normal respiratory effort without tachypnea nor retractions. Breath sounds are clear  Gastrointestinal: Soft and nontender. No distention.  Musculoskeletal: Nontender with normal range of motion in all extremities.  Neurologic:  Normal speech and language. No gross focal neurologic deficits  Skin:  Skin is warm, dry and intact.  Psychiatric: Mood and affect are normal.   ____________________________________________    EKG  EKG viewed and interpreted by myself shows a normal sinus rhythm at 73 bpm with a narrow QRS, normal axis, normal intervals, no concerning ST changes.  ____________________________________________    RADIOLOGY  CT scan head is negative. Chest x-ray is negative.  ____________________________________________   INITIAL IMPRESSION / ASSESSMENT AND PLAN / ED COURSE  Pertinent labs & imaging results that were available during my care of the patient were reviewed by me and considered in my medical decision making (see chart for details).   Patient presents to the emergency department for a loss of consciousness followed by confusion.  Differential is quite broad at this time but would include syncopal episode, seizure, arrhythmia, ACS, Covid, metabolic or electrolyte abnormality, dehydration/hypovolemia.  We will check labs, cardiac enzymes, Covid swab, chest x-ray as well as a CT scan head given his fall and confusion.  Patient agreeable to work-up.  I reviewed EMS rhythm strip it appears to be more consistent with intermittent sinus tachycardia at around  150 bpm.  Appears to march out regularly.  We will continue to closely monitor while in the emergency department.  Patient's imaging is reassuring.  Lab work is overall reassuring as well.  Troponin negative.  We will repeat a troponin.  Patient receiving IV fluids.  Covid test is negative.  EKG is reassuring.  Overall the patient appears well.  Repeat troponin is negative.  I spoke to the patient and wife in depth.  Wife states that the patient was clenching his teeth that his eyes rolled back up.  To be having seizure-like activity.  Upon further questioning patient states he does drink significant amount of alcohol each day, last drink was yesterday.  No history of seizures previously.  I spoke to the patient about following up with his doctor to discuss a Holter monitor as well as trying to slowly decrease the amount he drinks.  Patient agreeable to plan of care.  Discussed return precautions.  Norman Castillo was evaluated in Emergency Department on 10/21/2020 for the symptoms described in the history of present illness. He was evaluated in the context of the global COVID-19 pandemic, which necessitated consideration that the patient might be at risk for infection with the SARS-CoV-2 virus that causes  COVID-19. Institutional protocols and algorithms that pertain to the evaluation of patients at risk for COVID-19 are in a state of rapid change based on information released by regulatory bodies including the CDC and federal and state organizations. These policies and algorithms were followed during the patient's care in the ED.  ____________________________________________   FINAL CLINICAL IMPRESSION(S) / ED DIAGNOSES  Loss of consciousness Seizure   Harvest Dark, MD 10/21/20 1531

## 2020-10-21 NOTE — ED Notes (Signed)
Pt signed physical discharge form.

## 2020-10-21 NOTE — ED Triage Notes (Signed)
Pt BIB EMS from home after syncopal episode in the shower. Pt was found unresponsive by wife. EMS reports AMS x 20 mins upon arrival. Pt does not remember episode. EMS reports pt switching between NSR and sinus tach. Pt alert at this time

## 2020-11-04 ENCOUNTER — Ambulatory Visit: Payer: Medicare Other | Admitting: Neurology

## 2020-11-05 ENCOUNTER — Encounter: Payer: Self-pay | Admitting: Neurology

## 2020-11-05 ENCOUNTER — Other Ambulatory Visit: Payer: Self-pay | Admitting: Neurology

## 2020-11-09 ENCOUNTER — Other Ambulatory Visit: Payer: Self-pay

## 2020-11-09 ENCOUNTER — Ambulatory Visit (INDEPENDENT_AMBULATORY_CARE_PROVIDER_SITE_OTHER): Payer: Medicare Other | Admitting: Neurology

## 2020-11-09 ENCOUNTER — Encounter: Payer: Self-pay | Admitting: Neurology

## 2020-11-09 VITALS — BP 173/68 | HR 44 | Ht 60.0 in | Wt 199.5 lb

## 2020-11-09 DIAGNOSIS — F1010 Alcohol abuse, uncomplicated: Secondary | ICD-10-CM

## 2020-11-09 DIAGNOSIS — R569 Unspecified convulsions: Secondary | ICD-10-CM | POA: Diagnosis not present

## 2020-11-09 DIAGNOSIS — E230 Hypopituitarism: Secondary | ICD-10-CM | POA: Diagnosis not present

## 2020-11-09 DIAGNOSIS — G319 Degenerative disease of nervous system, unspecified: Secondary | ICD-10-CM

## 2020-11-09 NOTE — Patient Instructions (Signed)
You have driving restrictions for 6 month post seizure, no medication is needed.  Return for [facility]- memory  test in 2-3 mont.   Seizure, Adult A seizure is a sudden burst of abnormal electrical activity in the brain. Seizures usually last from 30 seconds to 2 minutes. They can cause many different symptoms. Usually, seizures are not harmful unless they last a long time. What are the causes? Common causes of this condition include:  Fever or infection.  Conditions that affect the brain, such as: ? A brain abnormality that you were born with. ? A brain or head injury. ? Bleeding in the brain. ? A tumor. ? Stroke. ? Brain disorders such as autism or cerebral palsy.  Low blood sugar.  Conditions that are passed from parent to child (are inherited).  Problems with substances, such as: ? Having a reaction to a drug or a medicine. ? Suddenly stopping the use of a substance (withdrawal). In some cases, the cause may not be known. A person who has repeated seizures over time without a clear cause has a condition called epilepsy. What increases the risk? You are more likely to get this condition if you have:  A family history of epilepsy.  Had a seizure in the past.  A brain disorder.  A history of head injury, lack of oxygen at birth, or strokes. What are the signs or symptoms? There are many types of seizures. The symptoms vary depending on the type of seizure you have. Examples of symptoms during a seizure include:  Shaking (convulsions).  Stiffness in the body.  Passing out (losing consciousness).  Head nodding.  Staring.  Not responding to sound or touch.  Loss of bladder control and bowel control. Some people have symptoms right before and right after a seizure happens. Symptoms before a seizure may include:  Fear.  Worry (anxiety).  Feeling like you may vomit (nauseous).  Feeling like the room is spinning (vertigo).  Feeling like you saw or heard something  before (dj vu).  Odd tastes or smells.  Changes in how you see. You may see flashing lights or spots. Symptoms after a seizure happens can include:  Confusion.  Sleepiness.  Headache.  Weakness on one side of the body. How is this treated? Most seizures will stop on their own in under 5 minutes. In these cases, no treatment is needed. Seizures that last longer than 5 minutes will usually need treatment. Treatment can include:  Medicines given through an IV tube.  Avoiding things that are known to cause your seizures. These can include medicines that you take for another condition.  Medicines to treat epilepsy.  Surgery to stop the seizures. This may be needed if medicines do not help. Follow these instructions at home: Medicines  Take over-the-counter and prescription medicines only as told by your doctor.  Do not eat or drink anything that may keep your medicine from working, such as alcohol. Activity  Do not do any activities that would be dangerous if you had another seizure, like driving or swimming. Wait until your doctor says it is safe for you to do them.  If you live in the [place] ask your local DMV (department of motor vehicles) when you can drive.  Get plenty of rest. Teaching others Teach friends and family what to do when you have a seizure. They should:  Lay you on the ground.  Protect your head and body.  Loosen any tight clothing around your neck.  Turn you on  your side.  Not hold you down.  Not put anything into your mouth.  Know whether or not you need emergency care.  Stay with you until you are better.  General instructions  Contact your doctor each time you have a seizure.  Avoid anything that gives you seizures.  Keep a seizure diary. Write down: ? What you think caused each seizure. ? What you remember about each seizure.  Keep all follow-up visits as told by your doctor. This is important. Contact a doctor if:  You have  another seizure.  You have seizures more often.  There is any change in what happens during your seizures.  You keep having seizures with treatment.  You have symptoms of being sick or having an infection. Get help right away if:  You have a seizure that: ? Lasts longer than 5 minutes. ? Is different than seizures you had before. ? Makes it harder to breathe. ? Happens after you hurt your head.  You have any of these symptoms after a seizure: ? Not being able to speak. ? Not being able to use a part of your body. ? Confusion. ? A bad headache.  You have two or more seizures in a row.  You do not wake up right after a seizure.  You get hurt during a seizure. These symptoms may be an emergency. Do not wait to see if the symptoms will go away. Get medical help right away. Call your local emergency services (911 in the [place] Do not drive yourself to the hospital. Summary  Seizures usually last from 30 seconds to 2 minutes. Usually, they are not harmful unless they last a long time.  Do not eat or drink anything that may keep your medicine from working, such as alcohol.  Teach friends and family what to do when you have a seizure.  Contact your doctor each time you have a seizure. This information is not intended to replace advice given to you by your health care provider. Make sure you discuss any questions you have with your health care provider. Document Revised: 01/11/2019 Document Reviewed: 01/11/2019 Elsevier Patient Education  [place]. Alcohol Abuse and Nutrition Alcohol abuse is any pattern of alcohol consumption that harms your health, relationships, or work. Alcohol abuse can cause poor nutrition (malnutrition or malnourishment) and a lack of nutrients (nutrient deficiencies), which can lead to more complications. Alcohol abuse brings malnutrition and nutrient deficiencies in two ways:  It causes your liver to work abnormally. This affects how your body  divides (breaks down) and absorbs nutrients from food.  It causes you to eat poorly. Many people who abuse alcohol do not eat enough carbohydrates, protein, fat, vitamins, and minerals. Nutrients that are commonly lacking (deficient) in people who abuse alcohol include:  Vitamins. ? Vitamin A. This is needed for your vision, metabolism, and ability to fight off infections (immunity). ? B vitamins. These include folate, thiamine, and niacin. These are needed for new cell growth. ? Vitamin C. This plays an important role in wound healing, immunity, and helping your body to absorb iron. ? Vitamin D. This is necessary for your body to absorb and use calcium. It is produced by your liver, but you can also get it from food and from sun exposure.  Minerals. ? Calcium. This is needed for healthy bones as well as heart and blood vessel (cardiovascular) function. ? Iron. This is important for blood, muscle, and nervous system functioning. ? Magnesium. This plays an important  role in muscle and nerve function, and it helps to control blood sugar and blood pressure. ? Zinc. This is important for the normal functioning of your nervous system and digestive system (gastrointestinal tract). If you think that you have an alcohol dependency problem, or if it is hard to stop drinking because you feel sick or different when you do not use alcohol, talk with your health care provider or another health professional about where to get help. Nutrition is an essential factor in therapy for alcohol abuse. Your health care provider or diet and nutrition specialist (dietitian) will work with you to design a plan that can help to restore nutrients to your body and prevent the risk of complications. What is my plan? Your dietitian may develop a specific eating plan that is based on your condition and any other problems that you have. An eating plan will commonly include:  A balanced diet. ? Grains: 6-8 oz (170-227 g) a day.  Examples of 1 oz of whole grains include 1 cup of whole-wheat cereal,  cup of brown rice, or 1 slice of whole-wheat bread. ? Vegetables: 2-3 cups a day. Examples of 1 cup of vegetables include 2 medium carrots, 1 large tomato, or 2 stalks of celery. ? Fruits: 1-2 cups a day. Examples of 1 cup of fruit include 1 large banana, 1 small apple, 8 large strawberries, or 1 large orange. ? Meat and other protein: 5-6 oz (142-170 g) a day.  A cut of meat or fish that is the size of a deck of cards is about 3-4 oz.  Foods that provide 1 oz of protein include 1 egg,  cup of nuts or seeds, or 1 tablespoon (16 g) of peanut butter. ? Dairy: 2-3 cups a day. Examples of 1 cup of dairy include 8 oz (230 mL) of milk, 8 oz (230 g) of yogurt, or 1 oz (44 g) of natural cheese.  Vitamin and mineral supplements. What are tips for following this plan?  Eat frequent meals and snacks. Try to eat 5-6 small meals each day.  Take vitamin or mineral supplements as recommended by your dietitian.  If you are malnourished or if your dietitian recommends it: ? You may follow a high-protein, high-calorie diet. This may include:  2,000-3,000 calories (kilocalories) a day.  70-100 g (grams) of protein a day. ? You may be directed to follow a diet that includes a complete nutritional supplement beverage. This can help to restore calories, protein, and vitamins to your body. Depending on your condition, you may be advised to consume this beverage instead of your meals or in addition to them.  Certain medicines may cause changes in your appetite, taste, and weight. Work with your health care provider and dietitian to make any changes to your medicines and eating plan.  If you are unable to take in enough food and calories by mouth, your health care provider may recommend a feeding tube. This tube delivers nutritional supplements directly to your stomach. Recommended foods  Eat foods that are high in molecules that prevent  oxygen from reacting with your food (antioxidants). These foods include grapes, berries, nuts, green tea, and dark green or orange vegetables. Eating these can help to prevent some of the stress that is placed on your liver by consuming alcohol.  Eat a variety of fresh fruits and vegetables each day. This will help you to get fiber and vitamins in your diet.  Drink plenty of water and other clear fluids, such as  apple juice and broth. Try to drink at least 48-64 oz (1.5-2 L) of water a day.  Include foods fortified with vitamins and minerals in your diet. Commonly fortified foods include milk, orange juice, cereal, and bread.  Eat a variety of foods that are high in omega-3 and omega-6 fatty acids. These include fish, nuts and seeds, and soybeans. These foods may help your liver to recover and may also stabilize your mood.  If you are a vegetarian: ? Eat a variety of protein-rich foods. ? Pair whole grains with plant-based proteins at meals and snack time. For example, eat rice with beans, put peanut butter on whole-grain toast, or eat oatmeal with sunflower seeds. The items listed above may not be a complete list of foods and beverages you can eat. Contact a dietitian for more information. Foods to avoid  Avoid foods and drinks that are high in fat and sugar. Sugary drinks, salty snacks, and candy contain empty calories. This means that they lack important nutrients such as protein, fiber, and vitamins.  Avoid alcohol. This is the best way to avoid malnutrition due to alcohol abuse. If you must drink, drink measured amounts. Measured drinking means limiting your intake to no more than 1 drink a day for nonpregnant women and 2 drinks a day for men. One drink equals 12 oz (355 mL) of beer, 5 oz (148 mL) of wine, or 1 oz (44 mL) of hard liquor.  Limit your intake of caffeine. Replace drinks like coffee and black tea with decaffeinated coffee and decaffeinated herbal tea. The items listed above  may not be a complete list of foods and beverages you should avoid. Contact a dietitian for more information. Summary  Alcohol abuse can cause poor nutrition (malnutrition or malnourishment) and a lack of nutrients (nutrient deficiencies), which can lead to more health problems.  Common nutrient deficiencies include vitamin deficiencies (A, B, C, and D) and mineral deficiencies (calcium, iron, magnesium, and zinc).  Nutrition is an essential factor in therapy for alcohol abuse.  Your health care provider and dietitian can help you to develop a specific eating plan that includes a balanced diet plus vitamin and mineral supplements. This information is not intended to replace advice given to you by your health care provider. Make sure you discuss any questions you have with your health care provider. Document Revised: 02/12/2019 Document Reviewed: 07/11/2017 Elsevier Patient Education  2020 [employer].

## 2020-11-09 NOTE — Progress Notes (Signed)
Provider:  Larey Seat, MD  Primary Care Physician:  Lenard Simmer, MD [address] [place] [address]     Referring Provider: Lenard Simmer, [place],  [place] [address]          Chief Complaint according to patient   Patient presents with:    . New Patient (Initial Visit)           HISTORY OF PRESENT ILLNESS:  Norman Castillo is a 70 y.o. year old  Caucasian male patient and seen upon a referral from Dr Ronnald Collum - [facility] on 11/09/2020  for a "Spell" .   The patient was in the shower on 10-21-20 and his wife tried to get his attention, he had been unresponsive, trembling, and collapsed. Wife noted his tongue caught between the teeth, locked, she couldn't open his jaw.  He had 5 drinks a day- many double.     Ellis Parents has a past medical history of Arthritis, Diabetes type 2, controlled ([place]), GERD (gastroesophageal reflux disease), Heart murmur, Hyperlipidemia, Hypertension, shoulder arthritis, and alcohol abuse.       Family medical /sleep history: no seizure history- Paternal grandmother with epilepsy. Alcoholism in both GF and father. .      Social history:  Patient is retired from [employer] and lives in a household with spouse-. Family status is married with 1 son with alcoholism- , no grandchildren.  The patient currently .Tobacco use;none.  ETOH use until very recently - , Caffeine intake in form of Coffee( 3-4 ) . Regular exercise in form of   Hobbies : guitar.       Review of Systems: Out of a complete 14 system review, the patient complains of only the following symptoms, and all other reviewed systems are negative.:      Social History   Socioeconomic History  . Marital status: Married    Spouse name: Not on file  . Number of children: Not on file  . Years of education: Not on file  . Highest education level: Not on file  Occupational History  . Not on file  Tobacco Use  . Smoking status:  Never Smoker  . Smokeless tobacco: Never Used  Vaping Use  . Vaping Use: Never used  Substance and Sexual Activity  . Alcohol use: Yes    Alcohol/week: 28.0 standard drinks    Types: 28 Shots of liquor per week  . Drug use: Not Currently  . Sexual activity: Not on file  Other Topics Concern  . Not on file  Social History Narrative  . Not on file   Social Determinants of Health   Financial Resource Strain: Not on file  Food Insecurity: Not on file  Transportation Needs: Not on file  Physical Activity: Not on file  Stress: Not on file  Social Connections: Not on file    Family History  Problem Relation Age of Onset  . Heart disease Mother     Past Medical History:  Diagnosis Date  . Arthritis   . Diabetes type 2, controlled ([place])   . GERD (gastroesophageal reflux disease)   . Heart murmur   . Hyperlipidemia   . Hypertension   . Pre-diabetes     Past Surgical History:  Procedure Laterality Date  . CATARACT EXTRACTION W/PHACO Left 01/23/2019   Procedure: CATARACT EXTRACTION PHACO AND INTRAOCULAR LENS PLACEMENT ([place]) LEFT AND DIABETIC;  Surgeon: Leandrew Koyanagi, MD;  Location: [facility];  Service: Ophthalmology;  Laterality: Left;  . COLONOSCOPY    . JOINT REPLACEMENT    . TONSILLECTOMY     childhood  . TOTAL KNEE ARTHROPLASTY Left 06/2017   [facility]     Current Outpatient Medications on File Prior to Visit  Medication Sig Dispense Refill  . allopurinol (ZYLOPRIM) 100 MG tablet Take 100 mg by mouth daily.    Marland Kitchen ALPRAZolam (XANAX) 0.5 MG tablet Take 0.5 mg by mouth daily as needed.    . Ascorbic Acid (VITAMIN C PO) Take by mouth daily.    Marland Kitchen aspirin 81 MG tablet Take 81 mg by mouth daily.    . Fluticasone Propionate (FLONASE ALLERGY RELIEF NA) Place into the nose daily as needed.    Marland Kitchen gemfibrozil (LOPID) 600 MG tablet Take 600 mg by mouth 2 (two) times daily before a meal.    . meloxicam (MOBIC) 15 MG tablet Take 15 mg by mouth daily.    . metFORMIN  (GLUCOPHAGE) 500 MG tablet Take 500 mg by mouth 2 (two) times daily with a meal.    . metoprolol succinate (TOPROL-XL) 50 MG 24 hr tablet Take 50 mg by mouth 2 (two) times daily. Take with or immediately following a meal.    . Multiple Vitamin (MULTIVITAMIN) tablet Take 1 tablet by mouth daily.    Marland Kitchen omeprazole (PRILOSEC) 40 MG capsule Take 40 mg by mouth daily.    . pravastatin (PRAVACHOL) 40 MG tablet Take 40 mg by mouth 2 (two) times daily.     No current facility-administered medications on file prior to visit.    Physical exam:  Today's Vitals   11/09/20 0933  BP: (!) 173/68  Pulse: (!) 44  Weight: 199 lb 8 oz (90.5 kg)  Height: 5' (1.524 m)   Body mass index is 38.96 kg/m.   Wt Readings from Last 3 Encounters:  11/09/20 199 lb 8 oz (90.5 kg)  01/23/19 199 lb (90.3 kg)     Ht Readings from Last 3 Encounters:  11/09/20 5' (1.524 m)  01/15/19 5\' 6"  (1.676 m)      General: The patient is awake, alert and appears not in acute distress. The patient is well groomed. Head: Normocephalic, atraumatic. Neck is supple. Mallampati 3,  neck circumference: 18 inches .  Nasal airflow  patent. Retrognathia is seen.  Dental status:  Cardiovascular:  Regular rate and cardiac rhythm by pulse,  without distended neck veins. Respiratory: Lungs are clear to auscultation.  Skin:  Without evidence of ankle edema, or rash. Trunk: The patient's posture is erect.   Neurologic exam : The patient is awake and alert, oriented to place and time.   Memory subjective described as intact.  Attention span & concentration ability appears normal.  Speech is fluent,  without  dysarthria, dysphonia or aphasia.  Mood and affect are appropriate.   Cranial nerves: no loss of smell or taste reported  Pupils are equal and briskly reactive to light. Funduscopic exam deferred.  Extraocular movements in vertical and horizontal planes were intact and without nystagmus. Right sided ptosis.  No  Diplopia. Visual fields by finger perimetry are intact. Hearing was intact to soft voice and finger rubbing. Facial sensation intact to fine touch. Facial motor strength is symmetric and tongue ( and uvula move midline.  Neck ROM : rotation, tilt and flexion extension were normal for age and shoulder shrug was symmetrical.    Motor exam:  Symmetric bulk, tone and ROM. Right shoulder limited ROM  and less grip strength on the right.  Normal tone without cog-wheeling, symmetric grip strength .   Sensory:  Fine touch, pinprick and vibration were normal.  Proprioception tested in the upper extremities was normal.   Coordination: Rapid alternating movements in the fingers/hands were of normal speed.  The Finger-to-nose maneuver was intact without evidence of ataxia, dysmetria or tremor.   Gait and station: Patient could rise unassisted from a seated position, walked without assistive device.  Stance is of normal width/ base and the patient turned with steps.  Toe and heel walk were deferred.  Deep tendon reflexes: in the  upper and lower extremities are symmetric and intact.  Babinski response was deferred.        After spending a total time of 45 minutes face to face and additional time for physical and neurologic examination, review of laboratory studies,  personal review of imaging studies, reports and results of other testing and review of referral information / records as far as provided in visit, I have established the following assessments:  1)  Single seizure. Alcohol related most likely.  Short term memory lapses have mounted.  2)  CT shows atrophy, ventriculomegaly and cortical atrophy.  3)  No previous EEG, CT to compare to ED studies, no history of trauma.    My Plan is to proceed with:  1) EEG  2)  Alcoholism treatment - AA recommended. 3) B complex, vitamins and possible prenatal -  I will ask him to return for [facility].   I would like to thank Ronnald Collum, Lourdes Sledge, MD and  Lenard Simmer, [place],  [place] [address] for allowing me to meet with and to take care of this pleasant patient.   In short, Norman Castillo is presenting with I plan to follow up either personally or through our NP within 2-3 month.   CC: I will share my notes with PCP. Marland Kitchen  Electronically signed by: Larey Seat, MD 11/09/2020 9:49 AM  [facility] Board certified by The [employer] of Sleep Medicine and Diplomate of the [employer] of Sleep Medicine. Board certified In Neurology through the [employer], Fellow of the [employer] of Neurology. Medical Director of [employer].

## 2020-11-11 ENCOUNTER — Ambulatory Visit (INDEPENDENT_AMBULATORY_CARE_PROVIDER_SITE_OTHER): Payer: Medicare Other | Admitting: Neurology

## 2020-11-11 DIAGNOSIS — G319 Degenerative disease of nervous system, unspecified: Secondary | ICD-10-CM

## 2020-11-11 DIAGNOSIS — F1010 Alcohol abuse, uncomplicated: Secondary | ICD-10-CM

## 2020-11-11 DIAGNOSIS — R569 Unspecified convulsions: Secondary | ICD-10-CM

## 2020-11-12 ENCOUNTER — Ambulatory Visit: Payer: Medicare Other | Admitting: Neurology

## 2020-11-24 DIAGNOSIS — G319 Degenerative disease of nervous system, unspecified: Secondary | ICD-10-CM | POA: Insufficient documentation

## 2020-11-24 DIAGNOSIS — F1010 Alcohol abuse, uncomplicated: Secondary | ICD-10-CM

## 2020-11-24 HISTORY — DX: Alcohol abuse, uncomplicated: F10.10

## 2020-11-24 NOTE — Procedures (Signed)
This EEG study was performed under the International system of electrode placements also known as the 10-20 system. Electrical activity was acquired as a sampling rate of 500 Hz high and low frequency filters were set, the EEG data were Recorded continuously. A video was not recorded. Background activity consists of 9 Hz of moderate voltage. With eye opening this predominant posterior rhythm promptly attenuates. Hyperventilation was performed creating amplitude increase and irregular EEG discharges. Following hyperventilation there were some excessive eye movements noted but no epileptiform discharges. Photic stimulation showed entrainment at all frequencies. The EKG remained in normal rhythm between 48 and 52 bpm.  This EEG did not indicate a seizure focus but during hyperventilation there was significant amplitude buildup and irregular non-simultaneous activity was noted between both hemispheres. I consider this a sign of irritability, Grade 1, as can be seen with metabolic -toxic non-static encephalopathies.    Larey Seat, MD  PS : Driving restrictions will remain for a total of 6 month since seizure onset.

## 2020-11-24 NOTE — Progress Notes (Signed)
No evidence of epileptiform focus or active seizure activity.

## 2020-11-25 ENCOUNTER — Telehealth: Payer: Self-pay | Admitting: Neurology

## 2020-11-25 NOTE — Telephone Encounter (Signed)
-----   Message from Larey Seat, MD sent at 11/24/2020  6:12 PM EST ----- No evidence of epileptiform focus or active seizure activity.

## 2020-11-25 NOTE — Telephone Encounter (Signed)
Called the patient and his wife and discussed the results with him. Advised there was no evidence of seizure like activity on the EEG testing that was completed. Informed that doesn't mean he didn't have a seizure previously, it just means was not actively having one. Pt verbalized understanding. He understands he still can't drive for 6 mths from the date of first event. Advised for pt to keep upcoming apt in April. Pt verbalized understanding.

## 2020-12-02 ENCOUNTER — Other Ambulatory Visit: Payer: Self-pay

## 2020-12-02 ENCOUNTER — Ambulatory Visit (INDEPENDENT_AMBULATORY_CARE_PROVIDER_SITE_OTHER): Payer: Medicare Other | Admitting: Dermatology

## 2020-12-02 DIAGNOSIS — L820 Inflamed seborrheic keratosis: Secondary | ICD-10-CM

## 2020-12-02 DIAGNOSIS — Z1283 Encounter for screening for malignant neoplasm of skin: Secondary | ICD-10-CM

## 2020-12-02 DIAGNOSIS — L219 Seborrheic dermatitis, unspecified: Secondary | ICD-10-CM | POA: Diagnosis not present

## 2020-12-02 DIAGNOSIS — L821 Other seborrheic keratosis: Secondary | ICD-10-CM | POA: Diagnosis not present

## 2020-12-02 DIAGNOSIS — L570 Actinic keratosis: Secondary | ICD-10-CM

## 2020-12-02 DIAGNOSIS — D1800 Hemangioma unspecified site: Secondary | ICD-10-CM

## 2020-12-02 DIAGNOSIS — L814 Other melanin hyperpigmentation: Secondary | ICD-10-CM | POA: Diagnosis not present

## 2020-12-02 DIAGNOSIS — D229 Melanocytic nevi, unspecified: Secondary | ICD-10-CM

## 2020-12-02 DIAGNOSIS — L578 Other skin changes due to chronic exposure to nonionizing radiation: Secondary | ICD-10-CM

## 2020-12-02 MED ORDER — HYDROCORTISONE 2.5 % EX CREA: TOPICAL_CREAM | Freq: Two times a day (BID) | CUTANEOUS | 3 refills | Status: AC | PRN

## 2020-12-02 NOTE — Patient Instructions (Addendum)

## 2020-12-02 NOTE — Progress Notes (Unsigned)
Follow-Up Visit   Subjective  Norman Castillo is a 70 y.o. male who presents for the following: Annual Exam (Full body mole check, pt c/o irritated itchy spots changing color on his face and neck ). The patient presents for Total-Body Skin Exam (TBSE) for skin cancer screening and mole check.  The following portions of the chart were reviewed this encounter and updated as appropriate:   Tobacco  Allergies  Meds  Problems  Med Hx  Surg Hx  Fam Hx     Review of Systems:  No other skin or systemic complaints except as noted in HPI or Assessment and Plan.  Objective  Well appearing patient in no apparent distress; mood and affect are within normal limits.  A full examination was performed including scalp, head, eyes, ears, nose, lips, neck, chest, axillae, abdomen, back, buttocks, bilateral upper extremities, bilateral lower extremities, hands, feet, fingers, toes, fingernails, and toenails. All findings within normal limits unless otherwise noted below.  Objective  L cheek, neck x  7 (8): Erythematous keratotic or waxy stuck-on papule or plaque.   Objective  face x 7 (7): Erythematous thin papules/macules with gritty scale.   Objective  post auricular: Pink patches with greasy scale.    Assessment & Plan  Inflamed seborrheic keratosis (8) L cheek, neck x  7  Destruction of lesion - L cheek, neck x  7 Complexity: simple   Destruction method: cryotherapy   Informed consent: discussed and consent obtained   Timeout:  patient name, date of birth, surgical site, and procedure verified Lesion destroyed using liquid nitrogen: Yes   Region frozen until ice ball extended beyond lesion: Yes   Outcome: patient tolerated procedure well with no complications   Post-procedure details: wound care instructions given    AK (actinic keratosis) (7) face x 7  Destruction of lesion - face x 7 Complexity: simple   Destruction method: cryotherapy   Informed consent: discussed and  consent obtained   Timeout:  patient name, date of birth, surgical site, and procedure verified Lesion destroyed using liquid nitrogen: Yes   Region frozen until ice ball extended beyond lesion: Yes   Outcome: patient tolerated procedure well with no complications   Post-procedure details: wound care instructions given    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 - Observe - Call for any changes  Melanocytic Nevi - Tan-brown and/or pink-flesh-colored symmetric macules and papules - Benign appearing on exam today - Observation - Call clinic for new or changing moles - Recommend daily use of broad spectrum spf 30+ sunscreen to sun-exposed areas.   Hemangiomas - Red papules - Discussed benign nature - Observe - Call for any changes  Actinic Damage - Chronic, secondary to cumulative UV/sun exposure - diffuse scaly erythematous macules with underlying dyspigmentation - Recommend daily broad spectrum sunscreen SPF 30+ to sun-exposed areas, reapply every 2 hours as needed.  - Call for new or changing lesions.  Skin cancer screening performed today.  Return in about 1 year (around 12/02/2021) for TBSE .  I,  Marye Round, CMA, am acting as scribe for Sarina Ser, MD .  Documentation: I have reviewed the above documentation for accuracy and completeness, and I agree with the above.  Sarina Ser, MD

## 2020-12-04 ENCOUNTER — Encounter: Payer: Self-pay | Admitting: Dermatology

## 2020-12-22 ENCOUNTER — Encounter: Payer: Self-pay | Admitting: Ophthalmology

## 2020-12-23 NOTE — Anesthesia Preprocedure Evaluation (Addendum)
Anesthesia Evaluation  Patient identified by MRN, date of birth, ID band Patient awake    Reviewed: Allergy & Precautions, NPO status , Patient's Chart, lab work & pertinent test results  Airway Mallampati: II  TM Distance: >3 FB Neck ROM: Full    Dental no notable dental hx.    Pulmonary    Pulmonary exam normal        Cardiovascular hypertension, Normal cardiovascular exam     Neuro/Psych Seizures -,  Alcohol abuseFrom his managing neurologist- "Called the patient and his wife and discussed the results with him. Advised there was no evidence of seizure like activity on the EEG testing that was completed. Informed that doesn't mean he didn't have a seizure previously, it just means was not actively having one. Pt verbalized understanding. He understands he still can't drive for 6 mths from the date of first event. Advised for pt to keep upcoming apt in April. Pt verbalized understanding."    GI/Hepatic GERD  Controlled,  Endo/Other  diabetes, Type 2  Renal/GU      Musculoskeletal  (+) Arthritis ,   Abdominal (+) + obese,   Peds  Hematology negative hematology ROS (+)   Anesthesia Other Findings   Reproductive/Obstetrics                           Anesthesia Physical Anesthesia Plan  ASA: III  Anesthesia Plan: MAC   Post-op Pain Management:    Induction: Intravenous  PONV Risk Score and Plan: 1 and Midazolam, TIVA and Treatment may vary due to age or medical condition  Airway Management Planned: Natural Airway  Additional Equipment: None  Intra-op Plan:   Post-operative Plan:   Informed Consent: I have reviewed the patients History and Physical, chart, labs and discussed the procedure including the risks, benefits and alternatives for the proposed anesthesia with the patient or authorized representative who has indicated his/her understanding and acceptance.     Dental advisory  given  Plan Discussed with: CRNA  Anesthesia Plan Comments:         Anesthesia Quick Evaluation

## 2020-12-28 ENCOUNTER — Other Ambulatory Visit: Payer: Self-pay

## 2020-12-28 ENCOUNTER — Other Ambulatory Visit
Admission: RE | Admit: 2020-12-28 | Discharge: 2020-12-28 | Disposition: A | Discharge status: Home / Self Care | Payer: Medicare Other | Source: Ambulatory Visit | Attending: Ophthalmology | Admitting: Ophthalmology

## 2020-12-28 DIAGNOSIS — Z01812 Encounter for preprocedural laboratory examination: Secondary | ICD-10-CM | POA: Diagnosis present

## 2020-12-28 DIAGNOSIS — Z20822 Contact with and (suspected) exposure to covid-19: Secondary | ICD-10-CM | POA: Insufficient documentation

## 2020-12-28 LAB — SARS CORONAVIRUS 2 (TAT 6-24 HRS): SARS Coronavirus 2: NEGATIVE

## 2020-12-28 NOTE — Discharge Instructions (Signed)

## 2020-12-30 ENCOUNTER — Ambulatory Visit
Admission: RE | Admit: 2020-12-30 | Discharge: 2020-12-30 | Disposition: A | Discharge status: Home / Self Care | Payer: Medicare Other | Attending: Ophthalmology | Admitting: Ophthalmology

## 2020-12-30 ENCOUNTER — Ambulatory Visit: Payer: Medicare Other | Admitting: Anesthesiology

## 2020-12-30 ENCOUNTER — Encounter
Admission: RE | Disposition: A | Discharge status: Home / Self Care | Payer: Self-pay | Source: Home / Self Care | Attending: Ophthalmology

## 2020-12-30 ENCOUNTER — Encounter: Payer: Self-pay | Admitting: Ophthalmology

## 2020-12-30 ENCOUNTER — Other Ambulatory Visit: Payer: Self-pay

## 2020-12-30 DIAGNOSIS — Z791 Long term (current) use of non-steroidal anti-inflammatories (NSAID): Secondary | ICD-10-CM | POA: Insufficient documentation

## 2020-12-30 DIAGNOSIS — Z79899 Other long term (current) drug therapy: Secondary | ICD-10-CM | POA: Diagnosis not present

## 2020-12-30 DIAGNOSIS — Z7982 Long term (current) use of aspirin: Secondary | ICD-10-CM | POA: Diagnosis not present

## 2020-12-30 DIAGNOSIS — Z7984 Long term (current) use of oral hypoglycemic drugs: Secondary | ICD-10-CM | POA: Insufficient documentation

## 2020-12-30 DIAGNOSIS — H2511 Age-related nuclear cataract, right eye: Secondary | ICD-10-CM | POA: Diagnosis present

## 2020-12-30 HISTORY — PX: CATARACT EXTRACTION W/PHACO: SHX586

## 2020-12-30 HISTORY — DX: Unspecified convulsions: R56.9

## 2020-12-30 LAB — GLUCOSE, CAPILLARY
Glucose-Capillary: 115 mg/dL — ABNORMAL HIGH (ref 70–99)
Glucose-Capillary: 109 mg/dL — ABNORMAL HIGH (ref 70–99)

## 2020-12-30 SURGERY — PHACOEMULSIFICATION, CATARACT, WITH IOL INSERTION
Anesthesia: Monitor Anesthesia Care | Site: Eye | Laterality: Right

## 2020-12-30 MED ORDER — EPINEPHRINE PF 1 MG/ML IJ SOLN
INTRAOCULAR | Status: DC | PRN
  Administered 2020-12-30: 63 mL via OPHTHALMIC

## 2020-12-30 MED ORDER — TETRACAINE HCL 0.5 % OP SOLN
1.0000 [drp] | OPHTHALMIC | Status: DC | PRN
  Administered 2020-12-30 (×3): 1 [drp] via OPHTHALMIC

## 2020-12-30 MED ORDER — NA HYALUR & NA CHOND-NA HYALUR 0.4-0.35 ML IO KIT
PACK | INTRAOCULAR | Status: DC | PRN
  Administered 2020-12-30: 1 mL via INTRAOCULAR

## 2020-12-30 MED ORDER — ARMC OPHTHALMIC DILATING DROPS
1.0000 "application " | OPHTHALMIC | Status: DC | PRN
  Administered 2020-12-30 (×3): 1 via OPHTHALMIC

## 2020-12-30 MED ORDER — MIDAZOLAM HCL 2 MG/2ML IJ SOLN
INTRAMUSCULAR | Status: DC | PRN
  Administered 2020-12-30: 2 mg via INTRAVENOUS

## 2020-12-30 MED ORDER — FENTANYL CITRATE (PF) 100 MCG/2ML IJ SOLN
INTRAMUSCULAR | Status: DC | PRN
  Administered 2020-12-30: 50 ug via INTRAVENOUS

## 2020-12-30 MED ORDER — LIDOCAINE HCL (PF) 2 % IJ SOLN
INTRAOCULAR | Status: DC | PRN
  Administered 2020-12-30: 2 mL

## 2020-12-30 MED ORDER — BRIMONIDINE TARTRATE-TIMOLOL 0.2-0.5 % OP SOLN
OPHTHALMIC | Status: DC | PRN
  Administered 2020-12-30: 1 [drp] via OPHTHALMIC

## 2020-12-30 MED ORDER — CEFUROXIME OPHTHALMIC INJECTION 1 MG/0.1 ML
INJECTION | OPHTHALMIC | Status: DC | PRN
  Administered 2020-12-30: 0.1 mL via INTRACAMERAL

## 2020-12-30 MED ORDER — LACTATED RINGERS IV SOLN: INTRAVENOUS | Status: DC

## 2020-12-30 SURGICAL SUPPLY — 23 items
CANNULA ANT/CHMB 27G (MISCELLANEOUS) ×1 IMPLANT
CANNULA ANT/CHMB 27GA (MISCELLANEOUS) ×2 IMPLANT
GLOVE SURG LX 7.5 STRW (GLOVE) ×1
GLOVE SURG LX STRL 7.5 STRW (GLOVE) ×1 IMPLANT
GLOVE SURG TRIUMPH 8.0 PF LTX (GLOVE) ×2 IMPLANT
GOWN STRL REUS W/ TWL LRG LVL3 (GOWN DISPOSABLE) ×2 IMPLANT
GOWN STRL REUS W/TWL LRG LVL3 (GOWN DISPOSABLE) ×4
LENS IOL DIOP 20.5 (Intraocular Lens) ×2 IMPLANT
LENS IOL TECNIS MONO 20.5 (Intraocular Lens) IMPLANT
MARKER SKIN DUAL TIP RULER LAB (MISCELLANEOUS) ×2 IMPLANT
NDL CAPSULORHEX 25GA (NEEDLE) ×1 IMPLANT
NDL FILTER BLUNT 18X1 1/2 (NEEDLE) ×2 IMPLANT
NEEDLE CAPSULORHEX 25GA (NEEDLE) ×2 IMPLANT
NEEDLE FILTER BLUNT 18X 1/2SAF (NEEDLE) ×2
NEEDLE FILTER BLUNT 18X1 1/2 (NEEDLE) ×2 IMPLANT
PACK CATARACT BRASINGTON (MISCELLANEOUS) ×2 IMPLANT
PACK EYE AFTER SURG (MISCELLANEOUS) ×2 IMPLANT
PACK OPTHALMIC (MISCELLANEOUS) ×2 IMPLANT
SOLUTION OPHTHALMIC SALT (MISCELLANEOUS) ×2 IMPLANT
SYR 3ML LL SCALE MARK (SYRINGE) ×4 IMPLANT
SYR TB 1ML LUER SLIP (SYRINGE) ×2 IMPLANT
WATER STERILE IRR 250ML POUR (IV SOLUTION) ×2 IMPLANT
WIPE NON LINTING 3.25X3.25 (MISCELLANEOUS) ×2 IMPLANT

## 2020-12-30 NOTE — H&P (Signed)

## 2020-12-30 NOTE — Op Note (Signed)
LOCATION:  [place]   PREOPERATIVE DIAGNOSIS:    Nuclear sclerotic cataract right eye. H25.11   POSTOPERATIVE DIAGNOSIS:  Nuclear sclerotic cataract right eye.     PROCEDURE:  Phacoemusification with posterior chamber intraocular lens placement of the right eye   ULTRASOUND TIME: Procedure(s): CATARACT EXTRACTION PHACO AND INTRAOCULAR LENS PLACEMENT ([facility]) RIGHT DIABETIC 9.75 01:11.4 13.7% (Right)  LENS:   Implant Name Type Inv. Item Serial No. Manufacturer Lot No. LRB No. Used Action  LENS IOL DIOP 20.5 - [id number] Intraocular Lens LENS IOL DIOP 20.5 [id number]   Right 1 Implanted         SURGEON:  Wyonia Hough, MD   ANESTHESIA:  Topical with tetracaine drops and 2% Xylocaine jelly, augmented with 1% preservative-free intracameral lidocaine.    COMPLICATIONS:  None.   DESCRIPTION OF PROCEDURE:  The patient was identified in the holding room and transported to the operating room and placed in the supine position under the operating microscope.  The right eye was identified as the operative eye and it was prepped and draped in the usual sterile ophthalmic fashion.   A 1 millimeter clear-corneal paracentesis was made at the 12:00 position.  0.5 ml of preservative-free 1% lidocaine was injected into the anterior chamber. The anterior chamber was filled with Viscoat viscoelastic.  A 2.4 millimeter keratome was used to make a near-clear corneal incision at the 9:00 position.  A curvilinear capsulorrhexis was made with a cystotome and capsulorrhexis forceps.  Balanced salt solution was used to hydrodissect and hydrodelineate the nucleus.   Phacoemulsification was then used in stop and chop fashion to remove the lens nucleus and epinucleus.  The remaining cortex was then removed using the irrigation and aspiration handpiece. Provisc was then placed into the capsular bag to distend it for lens placement.  A lens was then injected into the capsular bag.  The  remaining viscoelastic was aspirated.   Wounds were hydrated with balanced salt solution.  The anterior chamber was inflated to a physiologic pressure with balanced salt solution.  No wound leaks were noted. Cefuroxime 0.1 ml of a 10mg /ml solution was injected into the anterior chamber for a dose of 1 mg of intracameral antibiotic at the completion of the case.   Timolol and Brimonidine drops were applied to the eye.  The patient was taken to the recovery room in stable condition without complications of anesthesia or surgery.   Norman Castillo 12/30/2020, 8:38 AM

## 2020-12-30 NOTE — Anesthesia Procedure Notes (Signed)
Date/Time: 12/30/2020 8:21 AM Performed by: Dionne Bucy, CRNA Pre-anesthesia Checklist: Patient identified, Emergency Drugs available, Suction available, Patient being monitored and Timeout performed Oxygen Delivery Method: Nasal cannula Placement Confirmation: positive ETCO2

## 2020-12-30 NOTE — Anesthesia Postprocedure Evaluation (Signed)
Anesthesia Post Note  Patient: Norman Castillo  Procedure(s) Performed: CATARACT EXTRACTION PHACO AND INTRAOCULAR LENS PLACEMENT ([facility]) RIGHT DIABETIC 9.75 01:11.4 13.7% (Right Eye)     Patient location during evaluation: PACU Anesthesia Type: MAC Level of consciousness: awake and alert Pain management: pain level controlled Vital Signs Assessment: post-procedure vital signs reviewed and stable Respiratory status: spontaneous breathing and nonlabored ventilation Cardiovascular status: blood pressure returned to baseline Postop Assessment: no apparent nausea or vomiting Anesthetic complications: no   No complications documented.  Arilla Hice [employer]

## 2020-12-30 NOTE — Transfer of Care (Signed)
Immediate Anesthesia Transfer of Care Note  Patient: Norman Castillo  Procedure(s) Performed: CATARACT EXTRACTION PHACO AND INTRAOCULAR LENS PLACEMENT ([facility]) RIGHT DIABETIC 9.75 01:11.4 13.7% (Right Eye)  Patient Location: PACU  Anesthesia Type: MAC  Level of Consciousness: awake, alert  and patient cooperative  Airway and Oxygen Therapy: Patient Spontanous Breathing and Patient connected to supplemental oxygen  Post-op Assessment: Post-op Vital signs reviewed, Patient's Cardiovascular Status Stable, Respiratory Function Stable, Patent Airway and No signs of Nausea or vomiting  Post-op Vital Signs: Reviewed and stable  Complications: No complications documented.

## 2020-12-31 ENCOUNTER — Encounter: Payer: Self-pay | Admitting: Ophthalmology

## 2021-02-08 ENCOUNTER — Encounter: Payer: Self-pay | Admitting: Family Medicine

## 2021-02-08 ENCOUNTER — Ambulatory Visit (INDEPENDENT_AMBULATORY_CARE_PROVIDER_SITE_OTHER): Payer: Medicare Other | Admitting: Family Medicine

## 2021-02-08 VITALS — BP 170/64 | HR 74 | Ht 66.0 in | Wt 189.0 lb

## 2021-02-08 DIAGNOSIS — R569 Unspecified convulsions: Secondary | ICD-10-CM | POA: Diagnosis not present

## 2021-02-08 NOTE — Patient Instructions (Signed)
Below is our plan:  We will continue to monitor symptoms. Please avoid regular use of alcohol.   Please make sure you are staying well hydrated. I recommend 50-60 ounces daily. Well balanced diet and regular exercise encouraged. Consistent sleep schedule with 6-8 hours recommended.   Please continue follow up with care team as directed.   Follow up in 4-5 months   You may receive a survey regarding today's visit. I encourage you to leave honest feed back as I do use this information to improve patient care. Thank you for seeing me today!    According to [place] law, you can not drive unless you are seizure / syncope free for at least 6 months and under physician's care.  Please maintain precautions. Do not participate in activities where a loss of awareness could harm you or someone else. No swimming alone, no tub bathing, no hot tubs, no driving, no operating motorized vehicles (cars, ATVs, motocycles, etc), lawnmowers, power tools or firearms. No standing at heights, such as rooftops, ladders or stairs. Avoid hot objects such as stoves, heaters, open fires. Wear a helmet when riding a bicycle, scooter, skateboard, etc. and avoid areas of traffic. Set your water heater to 120 degrees or less.     Seizure, Adult A seizure is a sudden burst of abnormal electrical and chemical activity in the brain. Seizures usually last from 30 seconds to 2 minutes.  What are the causes? Common causes of this condition include:  Fever or infection.  Problems that affect the brain. These may include: ? A brain or head injury. ? Bleeding in the brain. ? A brain tumor.  Low levels of blood sugar or salt.  Kidney problems or liver problems.  Conditions that are passed from parent to child (are inherited).  Problems with a substance, such as: ? Having a reaction to a drug or a medicine. ? Stopping the use of a substance all of a sudden (withdrawal).  A stroke.  Disorders that affect how you  develop. Sometimes, the cause may not be known.  What increases the risk?  Having someone in your family who has epilepsy. In this condition, seizures happen again and again over time. They have no clear cause.  Having had a tonic-clonic seizure before. This type of seizure causes you to: ? Tighten the muscles of the whole body. ? Lose consciousness.  Having had a head injury or strokes before.  Having had a lack of oxygen at birth. What are the signs or symptoms? There are many types of seizures. The symptoms vary depending on the type of seizure you have. Symptoms during a seizure  Shaking that you cannot control (convulsions) with fast, jerky movements of muscles.  Stiffness of the body.  Breathing problems.  Feeling mixed up (confused).  Staring or not responding to sound or touch.  Head nodding.  Eyes that blink, flutter, or move fast.  Drooling, grunting, or making clicking sounds with your mouth  Losing control of when you pee or poop. Symptoms before a seizure  Feeling afraid, nervous, or worried.  Feeling like you may vomit.  Feeling like: ? You are moving when you are not. ? Things around you are moving when they are not.  Feeling like you saw or heard something before (dj vu).  Odd tastes or smells.  Changes in how you see. You may see flashing lights or spots. Symptoms after a seizure  Feeling confused.  Feeling sleepy.  Headache.  Sore muscles. How is  this treated? If your seizure stops on its own, you will not need treatment. If your seizure lasts longer than 5 minutes, you will normally need treatment. Treatment may include:  Medicines given through an IV tube.  Avoiding things, such as medicines, that are known to cause your seizures.  Medicines to prevent seizures.  A device to prevent or control seizures.  Surgery.  A diet low in carbohydrates and high in fat (ketogenic diet). Follow these instructions at  home: Medicines  Take over-the-counter and prescription medicines only as told by your doctor.  Avoid foods or drinks that may keep your medicine from working, such as alcohol. Activity  Follow instructions about driving, swimming, or doing things that would be dangerous if you had another seizure. Wait until your doctor says it is safe for you to do these things.  If you live in the [place] ask your local department of motor vehicles when you can drive.  Get a lot of rest. Teaching others  Teach friends and family what to do when you have a seizure. They should: ? Help you get down to the ground. ? Protect your head and body. ? Loosen any clothing around your neck. ? Turn you on your side. ? Know whether or not you need emergency care. ? Stay with you until you are better.  Also, tell them what not to do if you have a seizure. Tell them: ? They should not hold you down. ? They should not put anything in your mouth.   General instructions  Avoid anything that gives you seizures.  Keep a seizure diary. Write down: ? What you remember about each seizure. ? What you think caused each seizure.  Keep all follow-up visits. Contact a doctor if:  You have another seizure or seizures. Call the doctor each time you have a seizure.  The pattern of your seizures changes.  You keep having seizures with treatment.  You have symptoms of being sick or having an infection.  You are not able to take your medicine. Get help right away if:  You have any of these problems: ? A seizure that lasts longer than 5 minutes. ? Many seizures in a row and you do not feel better between seizures. ? A seizure that makes it harder to breathe. ? A seizure and you can no longer speak or use part of your body.  You do not wake up right after a seizure.  You get hurt during a seizure.  You feel confused or have pain right after a seizure. These symptoms may be an emergency. Get help right away.  Call your local emergency services (911 in the [place]  Do not wait to see if the symptoms will go away.  Do not drive yourself to the hospital. Summary  A seizure is a sudden burst of abnormal electrical and chemical activity in the brain. Seizures normally last from 30 seconds to 2 minutes.  Causes of seizures include illness, injury to the head, low levels of blood sugar or salt, and certain conditions.  Most seizures will stop on their own in less than 5 minutes. Seizures that last longer than 5 minutes are a medical emergency and need treatment right away.  Many medicines are used to treat seizures. Take over-the-counter and prescription medicines only as told by your doctor. This information is not intended to replace advice given to you by your health care provider. Make sure you discuss any questions you have with your health care  provider. Document Revised: 05/01/2020 Document Reviewed: 05/01/2020 Elsevier Patient Education  [place].

## 2021-02-08 NOTE — Progress Notes (Signed)
Chief Complaint  Patient presents with  . Follow-up    Routine follow up Room 2, alone in room     HISTORY OF PRESENT ILLNESS: 02/08/21 ALL:  Norman Castillo is a 70 y.o. male here today for follow up for alcohol induced seizure and memory loss.  EEG did not show any concerns of epileptic activity but showed significant amplitude buildup and irregular non simultaneous activity noted between both hemispheres. Dr Dohmeier felt that this could be a sign of irritability that can been seen with metabolic encephalopathies. CT 10/2020 was unremarkable with exception of mild cerebral atrophy with ex vacuo dilatation. He denies concerns of memory loss. He feels most debilitating concern if right shoulder pain. BP is up and down. He does have some anxiety when in the doctor's office. He had stopped drinking for a period of about 3 months. He reports resuming about 1-2 alcoholic drinks per day. He feels that a shot of liquor works best to control right shoulder pain thought to be arthritic. He may drink wine at night. He has driven recently, without difficulty.     HISTORY (copied from Dr Dohmeier's previous note)  Norman Castillo a 70 y.o. year old  Caucasian male patientand seen upon a referralfrom Dr Ronnald Collum - [facility] on 11/09/2020 for a "Spell" .  The patient was in the shower on 10-21-20 and his wife tried to get his attention, he had been unresponsive, trembling, and collapsed. Wife noted his tongue caught between the teeth, locked, she couldn't open his jaw.  He had 5 drinks a day- many double.    Norman Castillo has a past medical history of Arthritis, Diabetes type 2, controlled ([place]), GERD (gastroesophageal reflux disease), Heart murmur, Hyperlipidemia, Hypertension, shoulder arthritis, and alcohol abuse.  Familymedical /sleep history:no seizure history- Paternal grandmother with epilepsy. Alcoholism in both GF and father. .    Social history:Patient is retired  from [employer] and lives in a household with spouse-. Family status is married with 1 son with alcoholism- , no grandchildren.  The patient currently .Tobacco use;none. ETOH use until very recently - , Caffeine intake in form of Coffee( 3-4 ) . Regular exercise in form of   Hobbies : guitar.    REVIEW OF SYSTEMS: Out of a complete 14 system review of symptoms, the patient complains only of the following symptoms, seizure, memory loss, and all other reviewed systems are negative.    ALLERGIES: No Known Allergies   HOME MEDICATIONS: Outpatient Medications Prior to Visit  Medication Sig Dispense Refill  . allopurinol (ZYLOPRIM) 100 MG tablet Take 100 mg by mouth daily.    Marland Kitchen ALPRAZolam (XANAX) 0.5 MG tablet Take 0.5 mg by mouth daily as needed.    . Ascorbic Acid (VITAMIN C PO) Take by mouth daily.    Marland Kitchen aspirin 81 MG tablet Take 81 mg by mouth daily.    . Fluticasone Propionate (FLONASE ALLERGY RELIEF NA) Place into the nose daily as needed.    Marland Kitchen gemfibrozil (LOPID) 600 MG tablet Take 600 mg by mouth 2 (two) times daily before a meal.    . hydrocortisone 2.5 % cream Apply topically 2 (two) times daily as needed (Rash). Apply to skin qd x 3 days prn flares 30 g 3  . meloxicam (MOBIC) 15 MG tablet Take 15 mg by mouth daily.    . metFORMIN (GLUCOPHAGE) 500 MG tablet Take 500 mg by mouth 2 (two) times daily with a meal.    .  metoprolol succinate (TOPROL-XL) 50 MG 24 hr tablet Take 50 mg by mouth 2 (two) times daily. Take with or immediately following a meal.    . Multiple Vitamin (MULTIVITAMIN) tablet Take 1 tablet by mouth daily.    Marland Kitchen omeprazole (PRILOSEC) 40 MG capsule Take 40 mg by mouth daily.    . pravastatin (PRAVACHOL) 40 MG tablet Take 40 mg by mouth 2 (two) times daily.     No facility-administered medications prior to visit.     PAST MEDICAL HISTORY: Past Medical History:  Diagnosis Date  . Arthritis   . Diabetes type 2, controlled ([place])   . GERD (gastroesophageal reflux  disease)   . Heart murmur   . Hyperlipidemia   . Hypertension   . Pre-diabetes   . Seizure [facility])    had first on on 10/23/20     PAST SURGICAL HISTORY: Past Surgical History:  Procedure Laterality Date  . CATARACT EXTRACTION W/PHACO Left 01/23/2019   Procedure: CATARACT EXTRACTION PHACO AND INTRAOCULAR LENS PLACEMENT ([facility]) LEFT AND DIABETIC;  Surgeon: Leandrew Koyanagi, MD;  Location: [facility];  Service: Ophthalmology;  Laterality: Left;  . CATARACT EXTRACTION W/PHACO Right 12/30/2020   Procedure: CATARACT EXTRACTION PHACO AND INTRAOCULAR LENS PLACEMENT ([facility]) RIGHT DIABETIC 9.75 01:11.4 13.7%;  Surgeon: Leandrew Koyanagi, MD;  Location: [facility];  Service: Ophthalmology;  Laterality: Right;  . COLONOSCOPY    . JOINT REPLACEMENT    . TONSILLECTOMY     childhood  . TOTAL KNEE ARTHROPLASTY Left 06/2017   [facility]     FAMILY HISTORY: Family History  Problem Relation Age of Onset  . Heart disease Mother      SOCIAL HISTORY: Social History   Socioeconomic History  . Marital status: Married    Spouse name: Vicky  . Number of children: Not on file  . Years of education: Not on file  . Highest education level: Not on file  Occupational History  . Occupation: part itme  Tobacco Use  . Smoking status: Never Smoker  . Smokeless tobacco: Never Used  Vaping Use  . Vaping Use: Never used  Substance and Sexual Activity  . Alcohol use: Not Currently    Comment: pt states he has stopped now  . Drug use: Not Currently  . Sexual activity: Not on file  Other Topics Concern  . Not on file  Social History Narrative   Lives with wife and son (part time)   Right handed   Drinks 6-8 cups caffeine daily    Social Determinants of Health   Financial Resource Strain: Not on file  Food Insecurity: Not on file  Transportation Needs: Not on file  Physical Activity: Not on file  Stress: Not on file  Social Connections: Not on file  Intimate Partner Violence:  Not on file      PHYSICAL EXAM  Vitals:   02/08/21 1254  BP: (!) 170/64  Pulse: 74  Weight: 189 lb (85.7 kg)  Height: 5\' 6"  (1.676 m)   Body mass index is 30.51 kg/m.   Generalized: Well developed, in no acute distress  Cardiology: normal rate and rhythm, no murmur auscultated  Respiratory: clear to auscultation bilaterally    Neurological examination  Mentation: Alert oriented to time, place, history taking. Follows all commands speech and language fluent Cranial nerve II-XII: Pupils were equal round reactive to light. Extraocular movements were full, visual field were full on confrontational test. Facial sensation and strength were normal. Uvula tongue midline. Head turning and shoulder shrug  were normal and symmetric. Motor: The motor testing reveals 5 over 5 strength of all 4 extremities. Good symmetric motor tone is noted throughout.  Sensory: Sensory testing is intact to soft touch on all 4 extremities. No evidence of extinction is noted.  Coordination: Cerebellar testing reveals good finger-nose-finger and heel-to-shin bilaterally.  Gait and station: Gait is normal. Tandem gait is normal. Romberg is negative. No drift is seen.  Reflexes: Deep tendon reflexes are symmetric and normal bilaterally.     DIAGNOSTIC DATA (LABS, IMAGING, TESTING) - I reviewed patient records, labs, notes, testing and imaging myself where available.  Lab Results  Component Value Date   WBC 4.8 10/21/2020   HGB 12.4 (L) 10/21/2020   HCT 36.5 (L) 10/21/2020   MCV 90.8 10/21/2020   PLT 214 10/21/2020      Component Value Date/Time   NA 135 10/21/2020 1233   K 3.9 10/21/2020 1233   CL 102 10/21/2020 1233   CO2 22 10/21/2020 1233   GLUCOSE 188 (H) 10/21/2020 1233   BUN 17 10/21/2020 1233   CREATININE 1.10 10/21/2020 1233   CALCIUM 9.2 10/21/2020 1233   PROT 7.2 10/21/2020 1233   ALBUMIN 4.1 10/21/2020 1233   AST 42 (H) 10/21/2020 1233   ALT 38 10/21/2020 1233   ALKPHOS 57  10/21/2020 1233   BILITOT 0.5 10/21/2020 1233   GFRNONAA >60 10/21/2020 1233   No results found for: CHOL, HDL, LDLCALC, LDLDIRECT, TRIG, CHOLHDL No results found for: HGBA1C No results found for: VITAMINB12 No results found for: TSH  No flowsheet data found.   Montreal Cognitive Assessment  02/08/2021  Visuospatial/ Executive (0/5) 5  Naming (0/3) 3  Attention: Read list of digits (0/2) 2  Attention: Read list of letters (0/1) 1  Attention: Serial 7 subtraction starting at 100 (0/3) 3  Language: Repeat phrase (0/2) 2  Language : Fluency (0/1) 1  Abstraction (0/2) 2  Delayed Recall (0/5) 5  Orientation (0/6) 6  Total 30     ASSESSMENT AND PLAN  70 y.o. year old male  has a past medical history of Arthritis, Diabetes type 2, controlled ([place]), GERD (gastroesophageal reflux disease), Heart murmur, Hyperlipidemia, Hypertension, Pre-diabetes, and Seizure ([place]). here with   Seizure [facility])  Neilan reports that he is feeling well. No seizure activity since 10/21/2020. He had stopped drinking for about 3 months but has recently resumed two drinks per day. He feels that this helps with pain management of right shoulder pain. We have discussed concerns of seizure related events with regular alcohol intake. I have encouraged him to consider limiting alcohol intake to 1-2 times weekly. He does not wish to start AED.  He will continue close follow up with Dr Ronnald Collum for primary care needs. Seizure precautions reviewed. He does not feel he has any difficulty with memory lapses or memory loss. CT showed mild cerebral atrophy with ex vacuo dilatation. MOCA 30/30. We have discussed findings and potential causes. He was encouraged to work on healthy lifestyle habits. He was advised of [place] Law that he should not drive for 6 months following last seizure on 10/21/2020. I will have him return for follow up in 4-5 months. He verbalizes understanding and agreement with this plan.   No orders of the  defined types were placed in this encounter.    No orders of the defined types were placed in this encounter.     I spent 20 minutes of face-to-face and non-face-to-face time with patient.  This included  previsit chart review, lab review, study review, order entry, electronic health record documentation, patient education.    Debbora Presto, MSN, FNP-C 02/08/2021, 2:40 PM  [facility] [address] [place], [place] [address] [phone]

## 2021-04-14 IMAGING — CR DG CHEST 2V
1 series · 3 of 3 positions shown · non-contrast
Comparison: None.

CLINICAL DATA: Syncope.

EXAM:
CHEST - 2 VIEW

[Series 1: dg chest 2 view · 0.14mm/px · 3 of 3 slices shown]
[im 1/3]
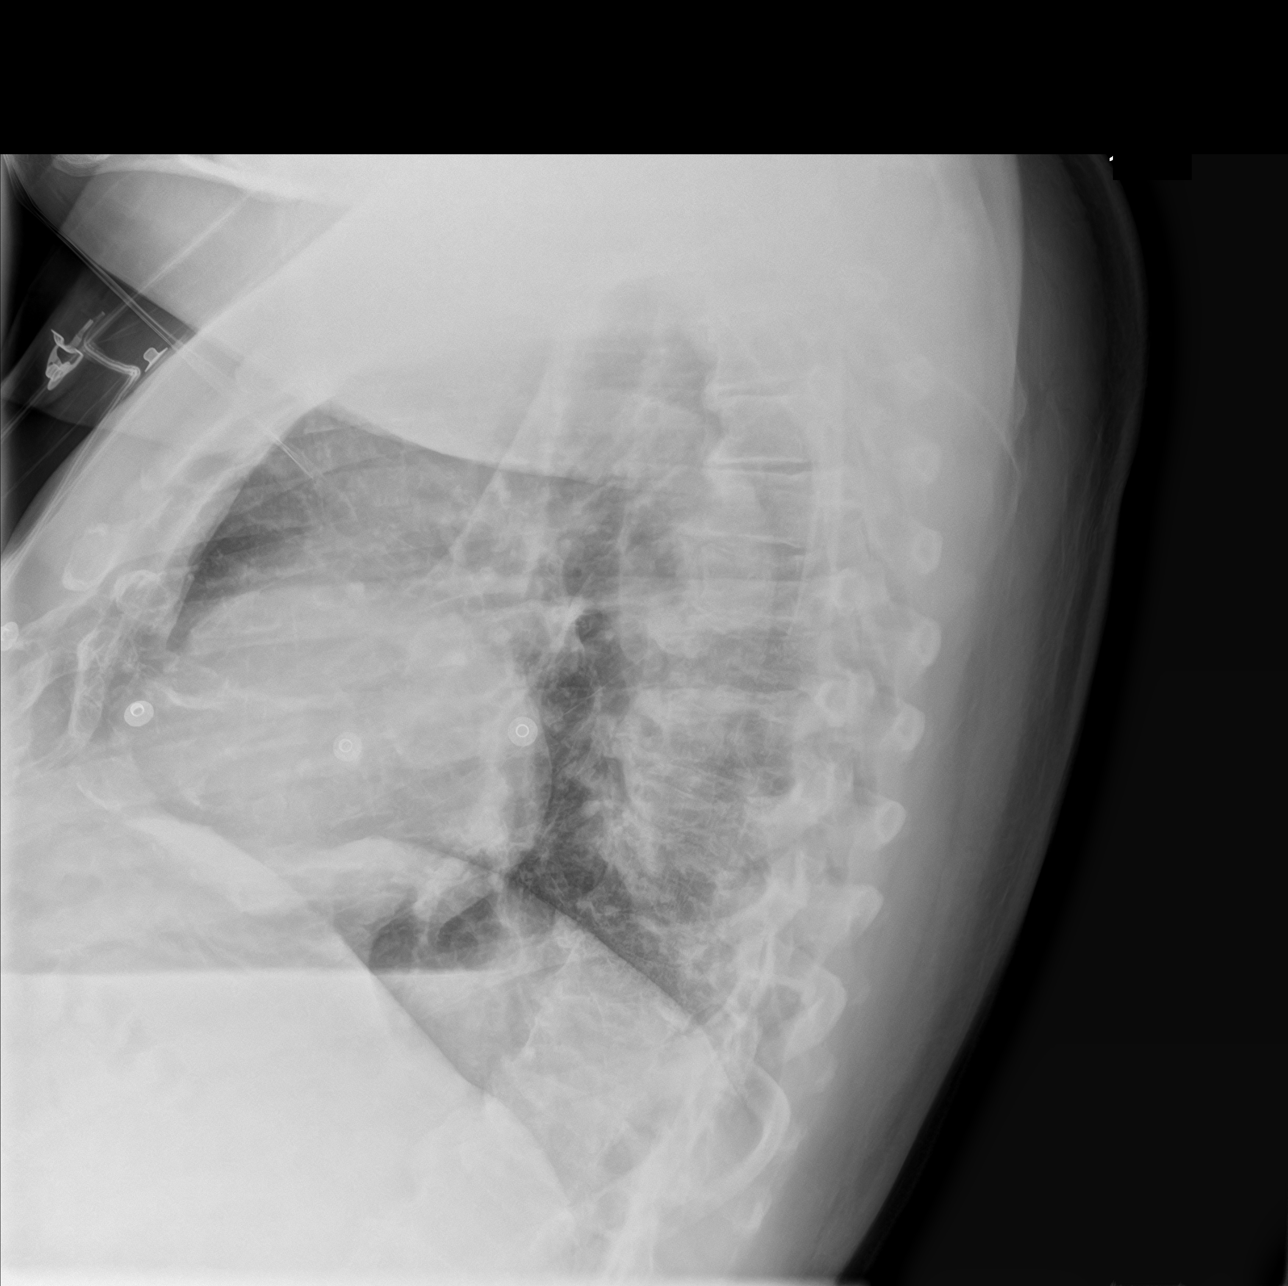
[im 2/3]
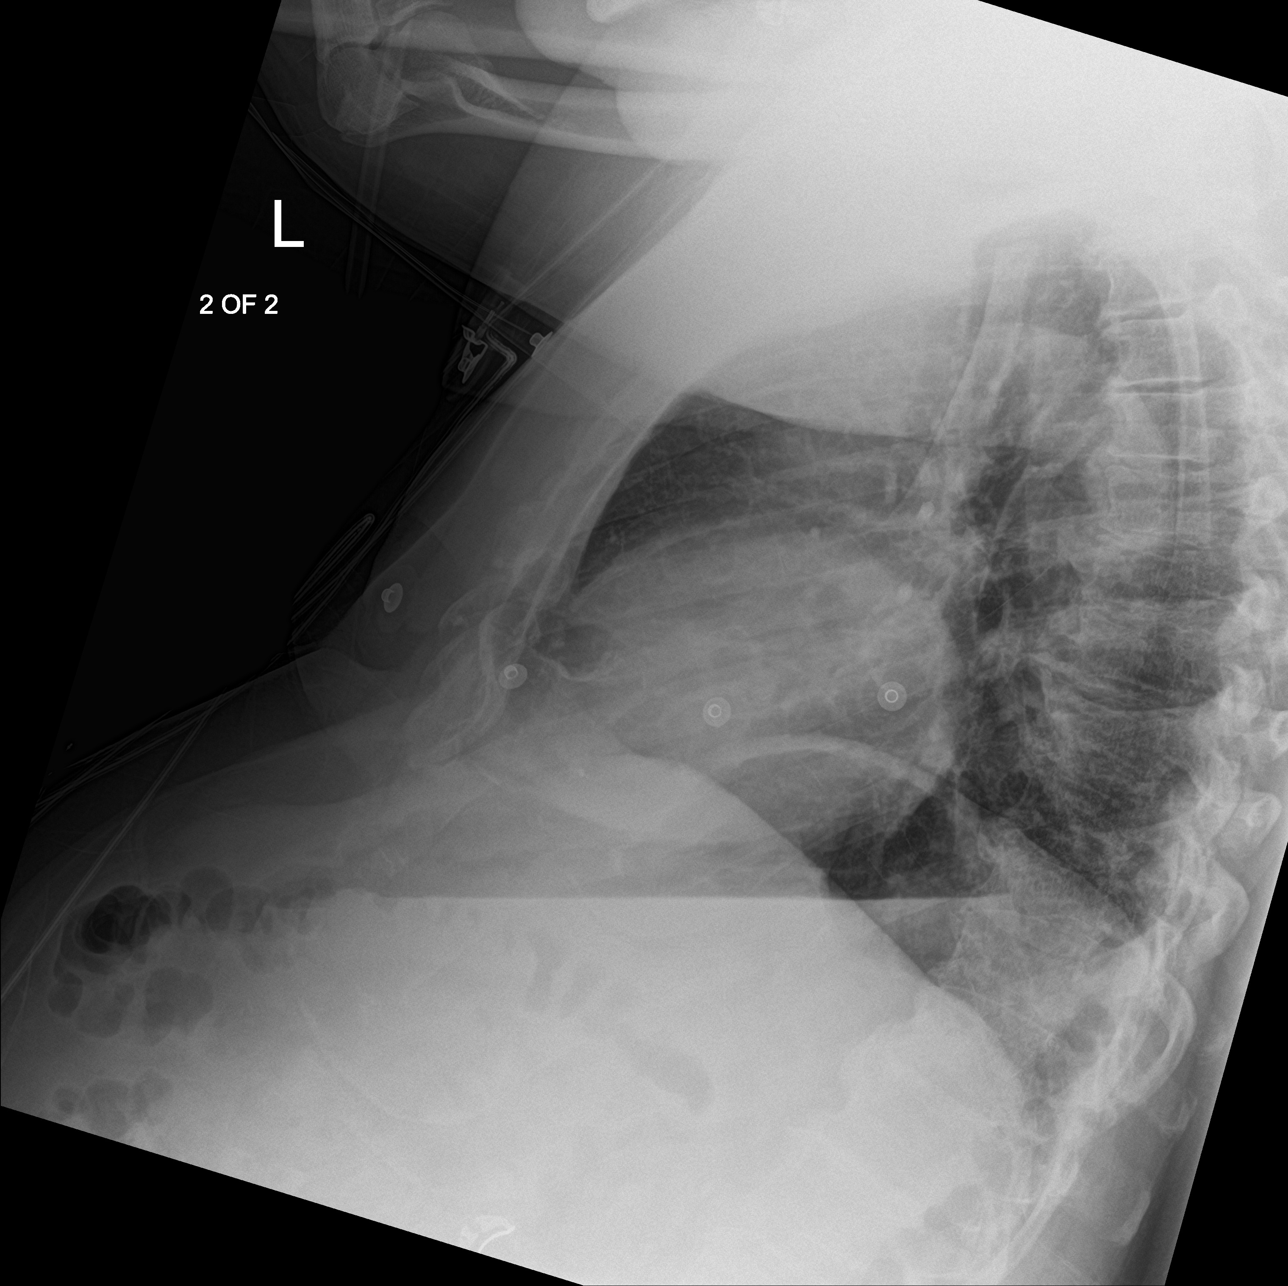
[im 3/3]
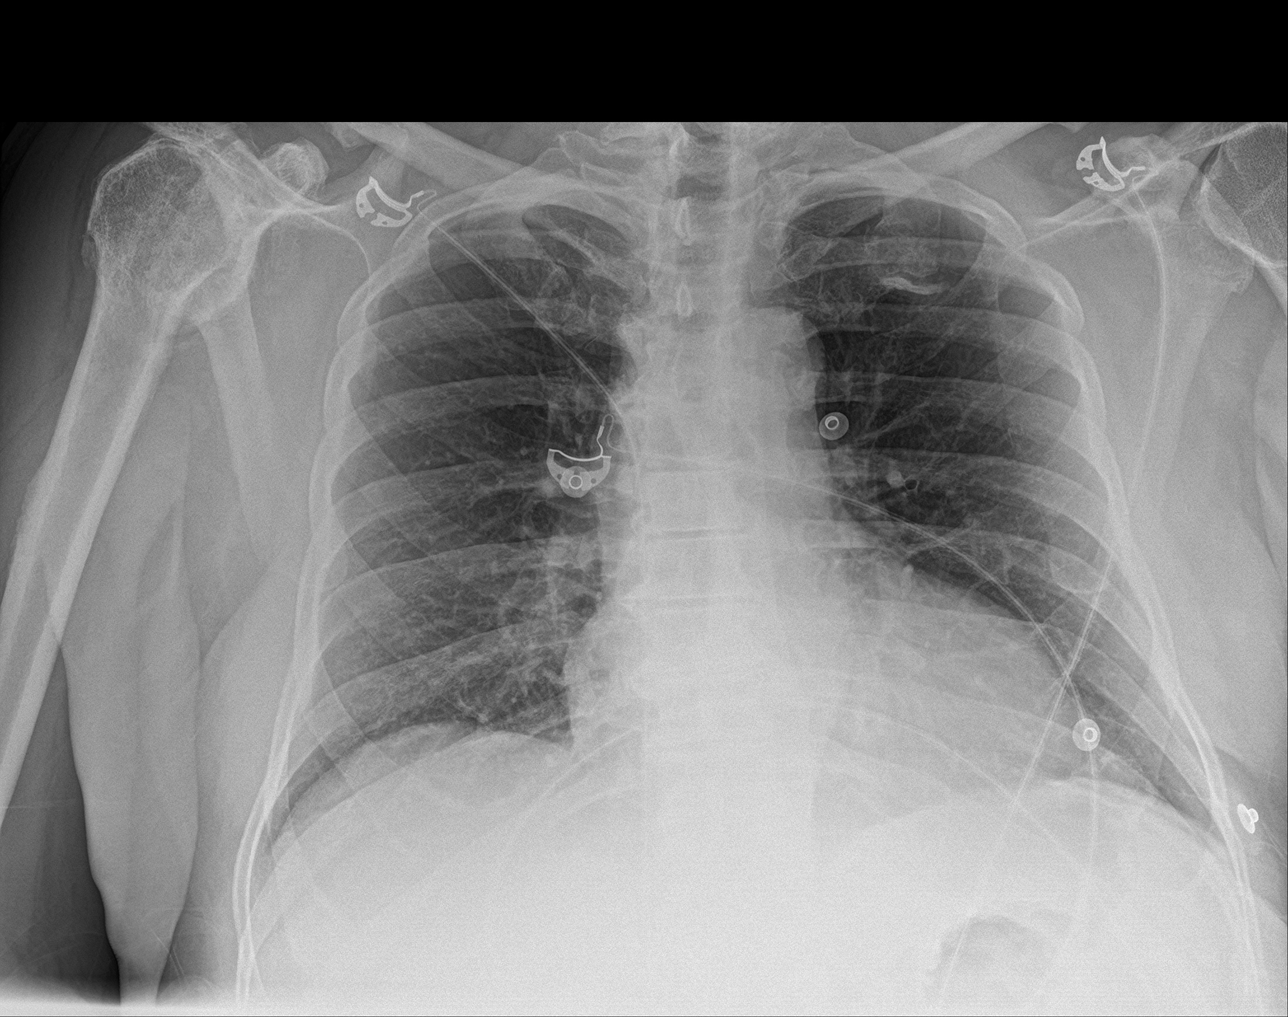

[3 of 3 positions shown; findings below may reference images not displayed]

FINDINGS: The heart size and mediastinal contours are within normal limits.
Both lungs are clear. The visualized skeletal structures are
unremarkable.
IMPRESSION: No active cardiopulmonary disease.

## 2021-04-14 IMAGING — CT CT HEAD W/O CM
4 series · 17 of 47 positions shown, 19 images · non-contrast
Comparison: None.

CLINICAL DATA: Head trauma, altered mental status, syncopal
episode.

EXAM:
CT HEAD WITHOUT CONTRAST
TECHNIQUE: Contiguous axial images were obtained from the base of the skull
through the vertex without intravenous contrast.

[Series 2: head wo · axial · 0.48mm/px · z∈[+429,+549]mm · 7 of 34 slices shown, 9 images]
[im 5/34  brain]
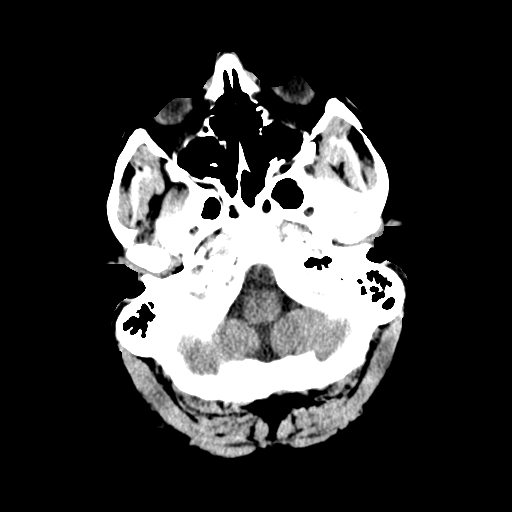
[im 5/34  bone]
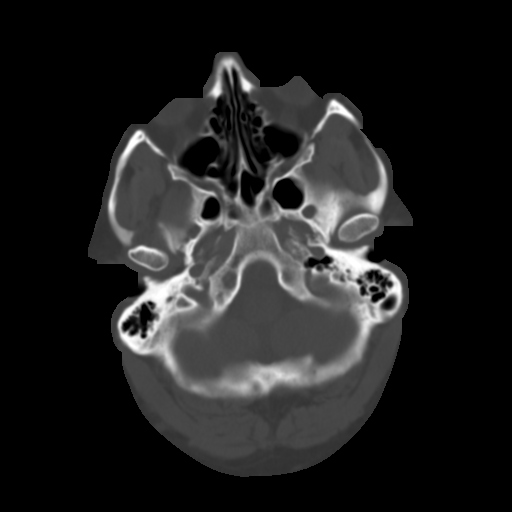
[im 9/34  brain]
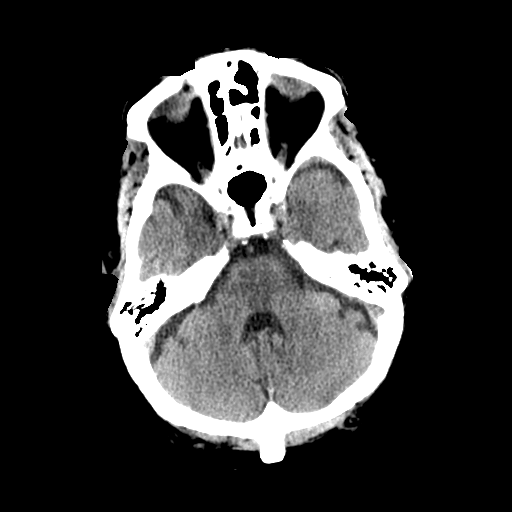
[im 13/34  brain]
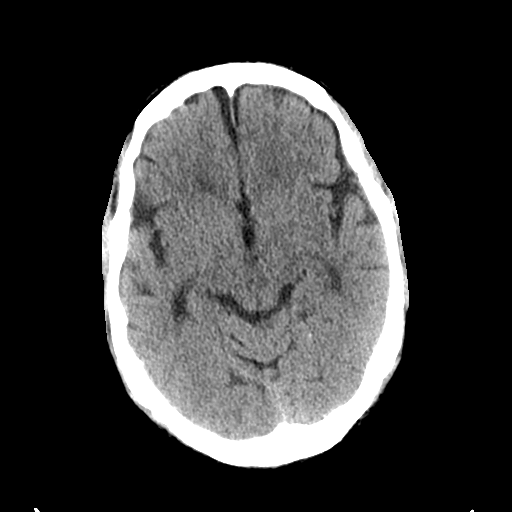
[im 17/34  brain]
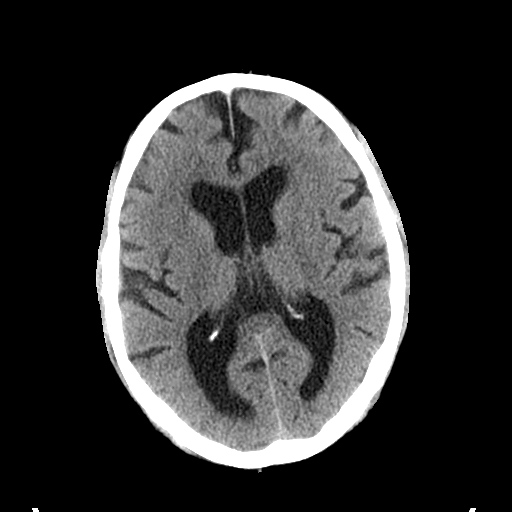
[im 21/34  brain]
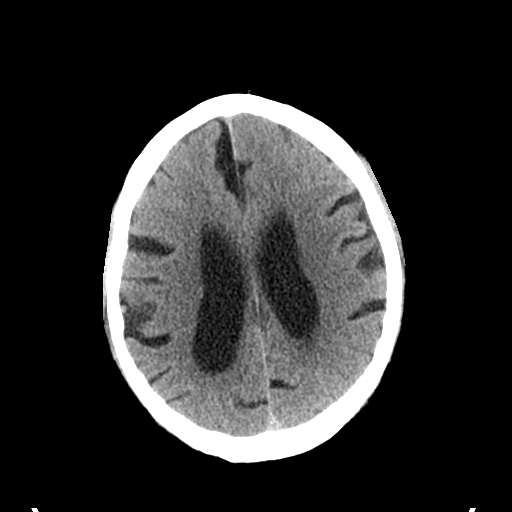
[im 21/34  bone]
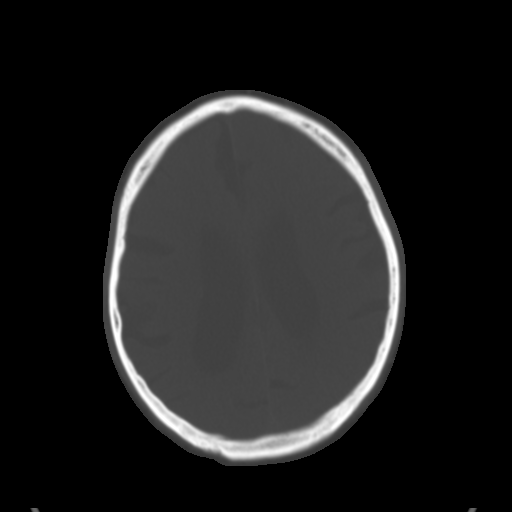
[im 25/34  brain]
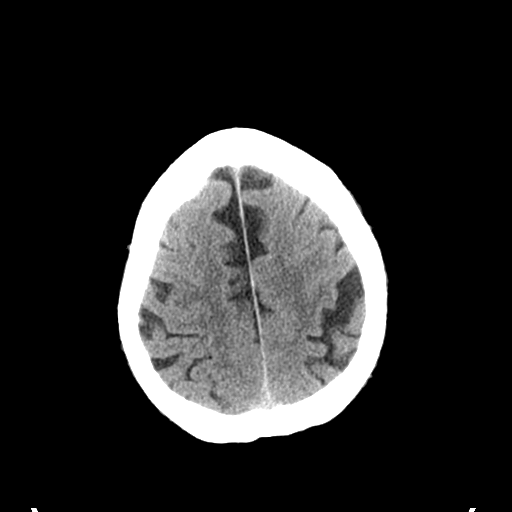
[im 29/34  brain]
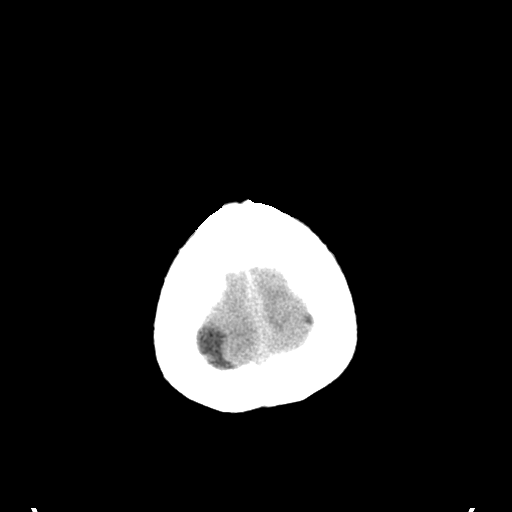

[Series 3: head bone · axial · 0.48mm/px · z∈[+425,+483]mm · 4 of 84 slices shown]
[im 9/84  bone]
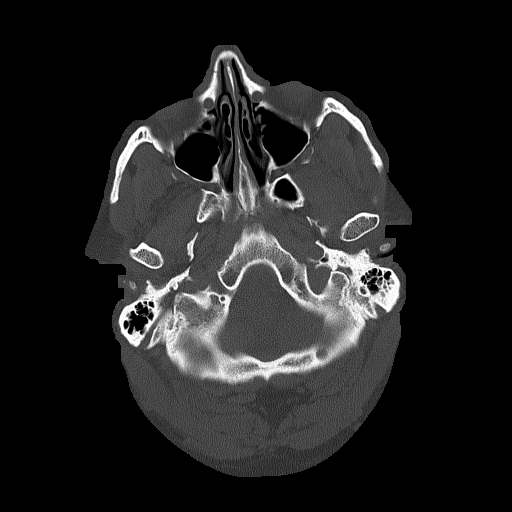
[im 17/84  bone]
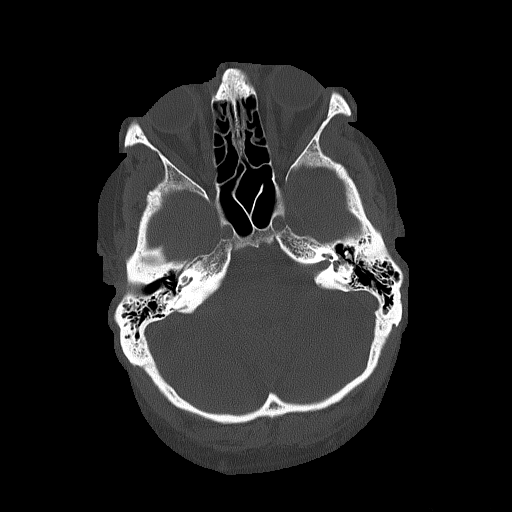
[im 25/84  bone]
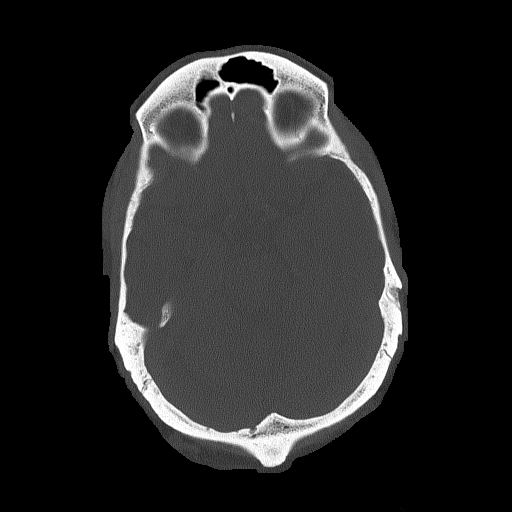
[im 38/84  bone]
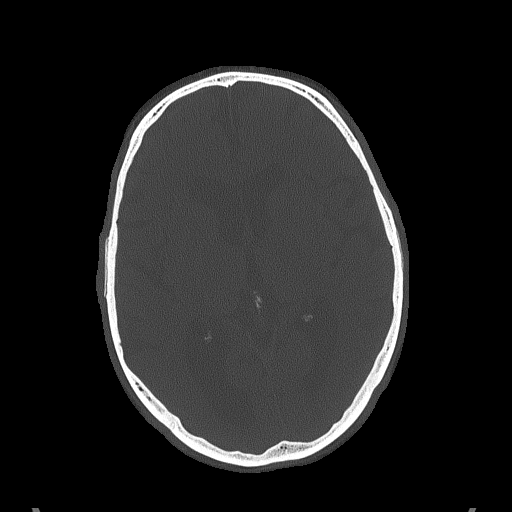

[Series 4: coronal soft tissue · coronal · 0.33mm/px · 3 of 77 slices shown]
[im 26/77  brain]
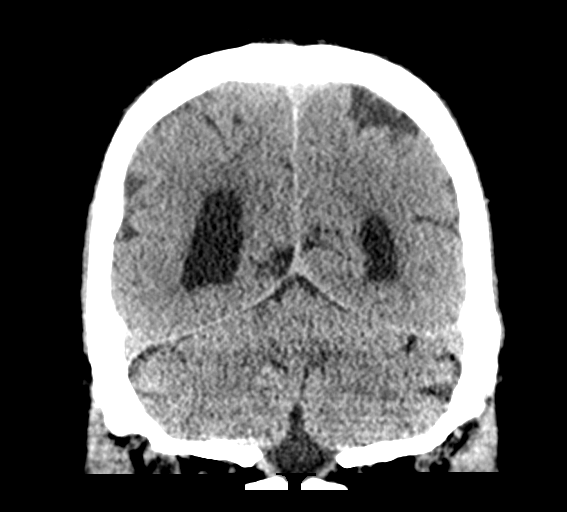
[im 34/77  brain]
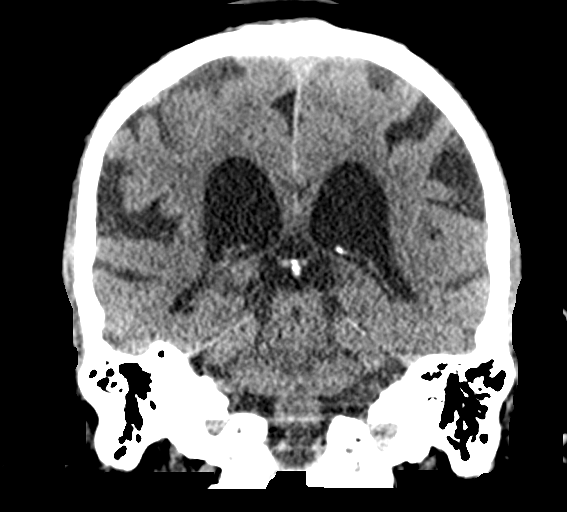
[im 43/77  brain]
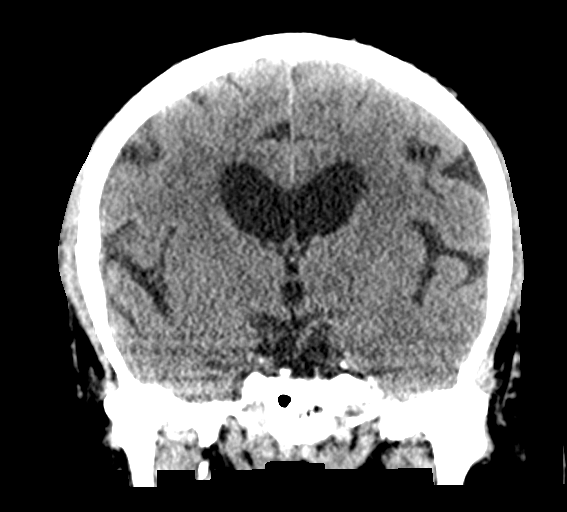

[Series 5: sagittal soft tissue · sagittal · 0.36mm/px · 3 of 63 slices shown]
[im 21/63  brain]
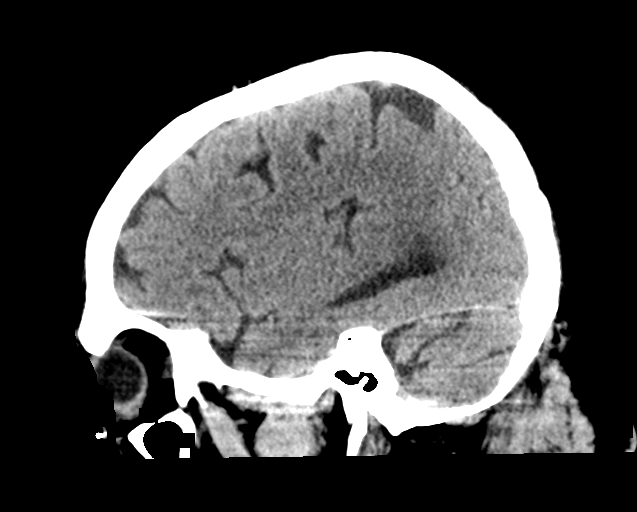
[im 32/63  brain]
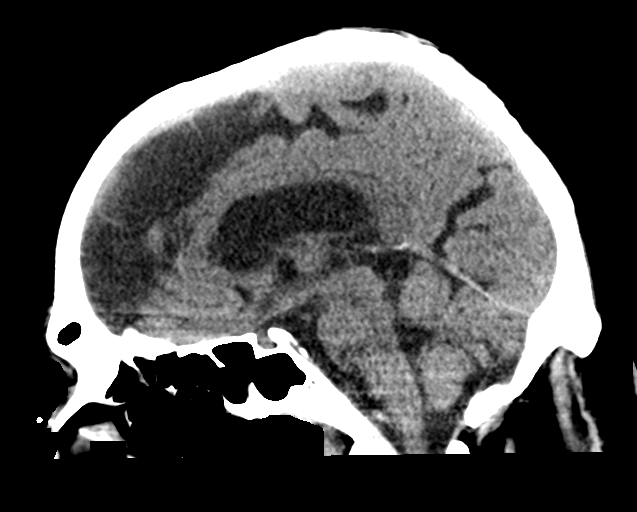
[im 42/63  brain]
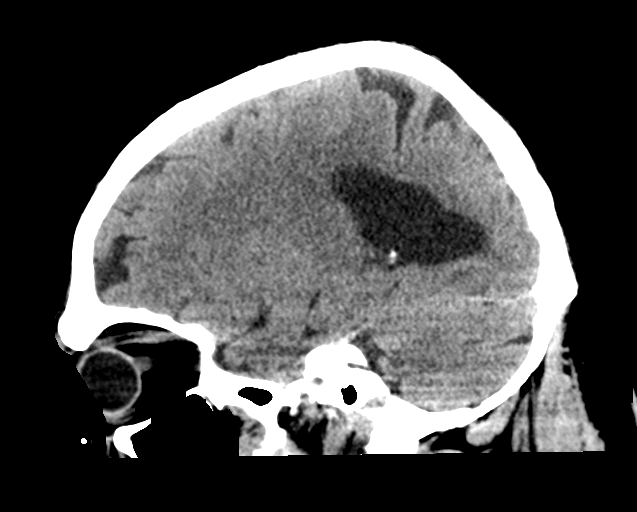

[17 of 47 positions shown; findings below may reference images not displayed]

FINDINGS: Brain: No acute infarct or intracranial hemorrhage. No mass lesion.
No midline shift, ventriculomegaly or extra-axial fluid collection.
Mild cerebral atrophy with ex vacuo dilatation.

Vascular: No hyperdense vessel or unexpected calcification. Carotid
siphon atherosclerotic calcifications.

Skull: Negative for fracture or focal lesion.

Sinuses/Orbits: No acute orbital finding. Pneumatized paranasal
sinuses and mastoid air cells.

Other: None.
IMPRESSION: No acute intracranial process.

## 2021-06-14 ENCOUNTER — Ambulatory Visit: Payer: Medicare Other | Admitting: Neurology

## 2021-12-06 ENCOUNTER — Encounter: Payer: Medicare Other | Admitting: Dermatology

## 2023-06-26 ENCOUNTER — Encounter: Payer: Self-pay | Admitting: Dermatology

## 2023-06-26 ENCOUNTER — Ambulatory Visit: Payer: Medicare HMO | Admitting: Dermatology

## 2023-06-26 VITALS — BP 144/72 | HR 53

## 2023-06-26 DIAGNOSIS — L814 Other melanin hyperpigmentation: Secondary | ICD-10-CM

## 2023-06-26 DIAGNOSIS — Z1283 Encounter for screening for malignant neoplasm of skin: Secondary | ICD-10-CM | POA: Diagnosis not present

## 2023-06-26 DIAGNOSIS — D1801 Hemangioma of skin and subcutaneous tissue: Secondary | ICD-10-CM

## 2023-06-26 DIAGNOSIS — D229 Melanocytic nevi, unspecified: Secondary | ICD-10-CM

## 2023-06-26 DIAGNOSIS — B353 Tinea pedis: Secondary | ICD-10-CM

## 2023-06-26 DIAGNOSIS — Z79899 Other long term (current) drug therapy: Secondary | ICD-10-CM

## 2023-06-26 DIAGNOSIS — W908XXA Exposure to other nonionizing radiation, initial encounter: Secondary | ICD-10-CM | POA: Diagnosis not present

## 2023-06-26 DIAGNOSIS — L578 Other skin changes due to chronic exposure to nonionizing radiation: Secondary | ICD-10-CM

## 2023-06-26 DIAGNOSIS — L570 Actinic keratosis: Secondary | ICD-10-CM | POA: Diagnosis not present

## 2023-06-26 DIAGNOSIS — L219 Seborrheic dermatitis, unspecified: Secondary | ICD-10-CM

## 2023-06-26 DIAGNOSIS — L821 Other seborrheic keratosis: Secondary | ICD-10-CM

## 2023-06-26 MED ORDER — TERBINAFINE HCL 250 MG PO TABS: 250.0000 mg | ORAL_TABLET | Freq: Every day | ORAL | 0 refills | Status: DC

## 2023-06-26 MED ORDER — MOMETASONE FUROATE 0.1 % EX CREA: TOPICAL_CREAM | CUTANEOUS | 1 refills | Status: DC

## 2023-06-26 MED ORDER — KETOCONAZOLE 2 % EX CREA: TOPICAL_CREAM | CUTANEOUS | 2 refills | Status: DC

## 2023-06-26 NOTE — Patient Instructions (Addendum)
Start Mometasone cream M, W, F to affected area at ear as needed for scaling.   Topical steroids (such as triamcinolone, fluocinolone, fluocinonide, mometasone, clobetasol, halobetasol, betamethasone, hydrocortisone) can cause thinning and lightening of the skin if they are used for too long in the same area. Your physician has selected the right strength medicine for your problem and area affected on the body. Please use your medication only as directed by your physician to prevent side effects.    Start Ketoconazole 2% cream twice daily to feet until 1 week post clearance.   Start Terbinafine 250 mg tablet once daily for 1 month.  Terbinafine Counseling  Terbinafine is an anti-fungal medicine that can be applied to the skin (over the counter) or taken by mouth (prescription) to treat fungal infections. The pill version is often used to treat fungal infections of the nails or scalp. While most people do not have any side effects from taking terbinafine pills, some possible side effects of the medicine can include taste changes, headache, loss of smell, vision changes, nausea, vomiting, or diarrhea.   Rare side effects can include irritation of the liver, allergic reaction, or decrease in blood counts (which may show up as not feeling well or developing an infection). If you are concerned about any of these side effects, please stop the medicine and call your doctor, or in the case of an emergency such as feeling very unwell, seek immediate medical care.     Cryotherapy Aftercare  Wash gently with soap and water everyday.   Apply Vaseline Jelly daily until healed.    Recommend daily broad spectrum sunscreen SPF 30+ to sun-exposed areas, reapply every 2 hours as needed. Call for new or changing lesions.  Staying in the shade or wearing long sleeves, sun glasses (UVA+UVB protection) and wide brim hats (4-inch brim around the entire circumference of the hat) are also recommended for sun  protection.    Melanoma ABCDEs  Melanoma is the most dangerous type of skin cancer, and is the leading cause of death from skin disease.  You are more likely to develop melanoma if you: Have light-colored skin, light-colored eyes, or red or blond hair Spend a lot of time in the sun Tan regularly, either outdoors or in a tanning bed Have had blistering sunburns, especially during childhood Have a close family member who has had a melanoma Have atypical moles or large birthmarks  Early detection of melanoma is key since treatment is typically straightforward and cure rates are extremely high if we catch it early.   The first sign of melanoma is often a change in a mole or a new dark spot.  The ABCDE system is a way of remembering the signs of melanoma.  A for asymmetry:  The two halves do not match. B for border:  The edges of the growth are irregular. C for color:  A mixture of colors are present instead of an even brown color. D for diameter:  Melanomas are usually (but not always) greater than 6mm - the size of a pencil eraser. E for evolution:  The spot keeps changing in size, shape, and color.  Please check your skin once per month between visits. You can use a small mirror in front and a large mirror behind you to keep an eye on the back side or your body.   If you see any new or changing lesions before your next follow-up, please call to schedule a visit.  Please continue daily skin protection  including broad spectrum sunscreen SPF 30+ to sun-exposed areas, reapplying every 2 hours as needed when you're outdoors.   Staying in the shade or wearing long sleeves, sun glasses (UVA+UVB protection) and wide brim hats (4-inch brim around the entire circumference of the hat) are also recommended for sun protection.    Due to recent changes in healthcare laws, you may see results of your pathology and/or laboratory studies on MyChart before the doctors have had a chance to review them. We  understand that in some cases there may be results that are confusing or concerning to you. Please understand that not all results are received at the same time and often the doctors may need to interpret multiple results in order to provide you with the best plan of care or course of treatment. Therefore, we ask that you please give [place] 2 business days to thoroughly review all your results before contacting the office for clarification. Should we see a critical lab result, you will be contacted sooner.   If You Need Anything After Your Visit  If you have any questions or concerns for your doctor, please call our main line at [phone] and press option 4 to reach your doctor's medical assistant. If no one answers, please leave a voicemail as directed and we will return your call as soon as possible. Messages left after 4 pm will be answered the following business day.   You may also send [place] a message via MyChart. We typically respond to MyChart messages within 1-2 business days.  For prescription refills, please ask your pharmacy to contact our office. Our fax number is [phone].  If you have an urgent issue when the clinic is closed that cannot wait until the next business day, you can page your doctor at the number below.    Please note that while we do our best to be available for urgent issues outside of office hours, we are not available 24/7.   If you have an urgent issue and are unable to reach [place], you may choose to seek medical care at your doctor's office, retail clinic, urgent care center, or emergency room.  If you have a medical emergency, please immediately call 911 or go to the emergency department.  Pager Numbers  - Dr. Gwen Pounds: [phone]  - Dr. Roseanne Reno: [phone]  - Dr. Katrinka Blazing: [phone]   In the event of inclement weather, please call our main line at [phone] for an update on the status of any delays or closures.  Dermatology Medication Tips: Please  keep the boxes that topical medications come in in order to help keep track of the instructions about where and how to use these. Pharmacies typically print the medication instructions only on the boxes and not directly on the medication tubes.   If your medication is too expensive, please contact our office at [phone] option 4 or send [place] a message through MyChart.   We are unable to tell what your co-pay for medications will be in advance as this is different depending on your insurance coverage. However, we may be able to find a substitute medication at lower cost or fill out paperwork to get insurance to cover a needed medication.   If a prior authorization is required to get your medication covered by your insurance company, please allow [place] 1-2 business days to complete this process.  Drug prices often vary depending on where the prescription is filled and some pharmacies may offer cheaper prices.  The website www.goodrx.com contains  coupons for medications through different pharmacies. The prices here do not account for what the cost may be with help from insurance (it may be cheaper with your insurance), but the website can give you the price if you did not use any insurance.  - You can print the associated coupon and take it with your prescription to the pharmacy.  - You may also stop by our office during regular business hours and pick up a GoodRx coupon card.  - If you need your prescription sent electronically to a different pharmacy, notify our office through [facility] or by phone at [phone] option 4.     Si Usted Necesita Algo Despus de Su Visita  Tambin puede enviarnos un mensaje a travs de Clinical cytogeneticist. Por lo general respondemos a los mensajes de MyChart en el transcurso de 1 a 2 das hbiles.  Para renovar recetas, por favor pida a su farmacia que se ponga en contacto con nuestra oficina. Annie Sable de fax es [place] [phone].  Si tiene un asunto urgente  cuando la clnica est cerrada y que no puede esperar hasta el siguiente da hbil, puede llamar/localizar a su doctor(a) al nmero que aparece a continuacin.   Por favor, tenga en cuenta que aunque hacemos todo lo posible para estar disponibles para asuntos urgentes fuera del horario de [place], no estamos disponibles las 24 horas del da, los 7 [address]  [address] de la [place].   Si tiene un problema urgente y no puede comunicarse con nosotros, puede optar por buscar atencin mdica  en el consultorio de su doctor(a), en una clnica privada, en un centro de atencin urgente o en una sala de emergencias.  Si tiene Engineer, drilling, por favor llame inmediatamente al 911 o vaya a la sala de emergencias.  Nmeros de bper  - Dr. Gwen Pounds: [phone]  - Dra. Roseanne Reno: [phone]  - Dr. Katrinka Blazing: [phone]   En caso de inclemencias del tiempo, por favor llame a Lacy Duverney principal al [phone] para una actualizacin sobre el [place] de cualquier retraso o cierre.  Consejos para la medicacin en dermatologa: Por favor, guarde las cajas en las que vienen los medicamentos de uso tpico para ayudarle a seguir las instrucciones sobre dnde y cmo usarlos. Las farmacias generalmente imprimen las instrucciones del medicamento slo en las cajas y no directamente en los tubos del [place].   Si su medicamento es muy caro, por favor, pngase en contacto con Rolm Gala llamando al [phone] y presione la opcin 4 o envenos un mensaje a travs de Clinical cytogeneticist.   No podemos decirle cul ser su copago por los medicamentos por adelantado ya que esto es diferente dependiendo de la cobertura de su seguro. Sin embargo, es posible que podamos encontrar un medicamento sustituto a Audiological scientist un formulario para que el seguro cubra el medicamento que se considera necesario.   Si se requiere una autorizacin previa para que su compaa de seguros [place] su medicamento, por favor permtanos de 1 a 2  das hbiles para completar [address]  Los precios de los medicamentos varan con frecuencia dependiendo del Environmental consultant de dnde se surte la receta y alguna farmacias pueden ofrecer precios ms baratos.  El sitio web www.goodrx.com tiene cupones para medicamentos de Health and safety inspector. Los precios aqu no tienen en cuenta lo que podra costar con la ayuda del seguro (puede ser ms barato con su seguro), pero el sitio web puede darle el precio si no utiliz Tourist information centre manager.  - Puede imprimir el  cupn correspondiente y llevarlo con su receta a la farmacia.  - Tambin puede pasar por nuestra oficina durante el horario de atencin regular y Education officer, museum una tarjeta de cupones de GoodRx.  - Si necesita que su receta se enve electrnicamente a una farmacia diferente, informe a nuestra oficina a travs de MyChart de [place] o por telfono llamando al [phone] y presione la opcin 4.

## 2023-06-26 NOTE — Progress Notes (Signed)
Follow-Up Visit   Subjective  Norman Castillo is a 72 y.o. male who presents for the following: Skin Cancer Screening and Full Body Skin Exam. No hx of skin cancer or dysplastic nevi. Lesion on left temple is tender and rough  The patient presents for Total-Body Skin Exam (TBSE) for skin cancer screening and mole check. The patient has spots, moles and lesions to be evaluated, some may be new or changing and the patient may have concern these could be cancer.  The following portions of the chart were reviewed this encounter and updated as appropriate: medications, allergies, medical history  Review of Systems:  No other skin or systemic complaints except as noted in HPI or Assessment and Plan.  Objective  Well appearing patient in no apparent distress; mood and affect are within normal limits.  A full examination was performed including scalp, head, eyes, ears, nose, lips, neck, chest, axillae, abdomen, back, buttocks, bilateral upper extremities, bilateral lower extremities, hands, feet, fingers, toes, fingernails, and toenails. All findings within normal limits unless otherwise noted below.   Relevant physical exam findings are noted in the Assessment and Plan.  face x5 (5) Erythematous thin papules/macules with gritty scale.     Assessment & Plan   SKIN CANCER SCREENING PERFORMED TODAY.  LENTIGINES, SEBORRHEIC KERATOSES, HEMANGIOMAS - Benign normal skin lesions - Benign-appearing - Call for any changes  MELANOCYTIC NEVI - Tan-brown and/or pink-flesh-colored symmetric macules and papules - Benign appearing on exam today - Observation - Call clinic for new or changing moles - Recommend daily use of broad spectrum spf 30+ sunscreen to sun-exposed areas.    SEBORRHEIC DERMATITIS Exam: Pink patches with greasy scale at  right post auricular  Chronic and persistent condition with duration or expected duration over one year. Condition is bothersome/symptomatic for patient.  Currently flared.  Seborrheic Dermatitis is a chronic persistent rash characterized by pinkness and scaling most commonly of the mid face but also can occur on the scalp (dandruff), ears; mid chest, mid back and groin.  It tends to be exacerbated by stress and cooler weather.  People who have neurologic disease may experience new onset or exacerbation of existing seborrheic dermatitis.  The condition is not curable but treatable and can be controlled.  Treatment Plan: Start Mometasone cream M, W, F at bedtime to affected areas at ear as needed for scaling.   Topical steroids (such as triamcinolone, fluocinolone, fluocinonide, mometasone, clobetasol, halobetasol, betamethasone, hydrocortisone) can cause thinning and lightening of the skin if they are used for too long in the same area. Your physician has selected the right strength medicine for your problem and area affected on the body. Please use your medication only as directed by your physician to prevent side effects.   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 Chronic and persistent condition with duration or expected duration over one year. Condition is symptomatic / bothersome to patient. Not to goal.  Treatment Plan: Start Ketoconazole 2% cream twice daily to feet until 1 week post clearance.   Start Terbinafine 250 mg tablet once daily for 1 month.    AK (actinic keratosis) (5) face x5  Actinic keratoses are precancerous spots that appear secondary to cumulative UV radiation exposure/sun exposure over time. They are chronic with expected duration over 1 year. A portion of actinic keratoses will progress to squamous cell carcinoma of the skin. It is not possible to reliably predict which spots will progress to skin cancer  and so treatment is recommended to prevent development of skin cancer.  Recommend daily broad spectrum sunscreen SPF 30+ to sun-exposed areas, reapply every 2 hours as  needed.  Recommend staying in the shade or wearing long sleeves, sun glasses (UVA+UVB protection) and wide brim hats (4-inch brim around the entire circumference of the hat). Call for new or changing lesions.  ACTINIC DAMAGE - Chronic condition, secondary to cumulative UV/sun exposure - diffuse scaly erythematous macules with underlying dyspigmentation - Recommend daily broad spectrum sunscreen SPF 30+ to sun-exposed areas, reapply every 2 hours as needed.  - Staying in the shade or wearing long sleeves, sun glasses (UVA+UVB protection) and wide brim hats (4-inch brim around the entire circumference of the hat) are also recommended for sun protection.  - Call for new or changing lesions.  Destruction of lesion - face x5 (5) Complexity: simple   Destruction method: cryotherapy   Informed consent: discussed and consent obtained   Timeout:  patient name, date of birth, surgical site, and procedure verified Lesion destroyed using liquid nitrogen: Yes   Region frozen until ice ball extended beyond lesion: Yes   Outcome: patient tolerated procedure well with no complications   Post-procedure details: wound care instructions given   Additional details:  Prior to procedure, discussed risks of blister formation, small wound, skin dyspigmentation, or rare scar following cryotherapy. Recommend Vaseline ointment to treated areas while healing.    Return in about 6 months (around 12/27/2023) for Rash Follow Up.  I, Lawson Radar, CMA, am acting as scribe for Armida Sans, MD.   Documentation: I have reviewed the above documentation for accuracy and completeness, and I agree with the above.  Armida Sans, MD

## 2023-07-03 ENCOUNTER — Encounter: Payer: Self-pay | Admitting: Dermatology

## 2024-01-02 ENCOUNTER — Encounter: Payer: Self-pay | Admitting: Dermatology

## 2024-01-02 ENCOUNTER — Ambulatory Visit: Payer: Medicare Other | Admitting: Dermatology

## 2024-01-02 DIAGNOSIS — L578 Other skin changes due to chronic exposure to nonionizing radiation: Secondary | ICD-10-CM | POA: Diagnosis not present

## 2024-01-02 DIAGNOSIS — W908XXA Exposure to other nonionizing radiation, initial encounter: Secondary | ICD-10-CM

## 2024-01-02 DIAGNOSIS — D1801 Hemangioma of skin and subcutaneous tissue: Secondary | ICD-10-CM

## 2024-01-02 DIAGNOSIS — L814 Other melanin hyperpigmentation: Secondary | ICD-10-CM

## 2024-01-02 DIAGNOSIS — L570 Actinic keratosis: Secondary | ICD-10-CM | POA: Diagnosis not present

## 2024-01-02 DIAGNOSIS — Z1283 Encounter for screening for malignant neoplasm of skin: Secondary | ICD-10-CM | POA: Diagnosis not present

## 2024-01-02 DIAGNOSIS — L821 Other seborrheic keratosis: Secondary | ICD-10-CM

## 2024-01-02 DIAGNOSIS — L820 Inflamed seborrheic keratosis: Secondary | ICD-10-CM | POA: Diagnosis not present

## 2024-01-02 DIAGNOSIS — D229 Melanocytic nevi, unspecified: Secondary | ICD-10-CM

## 2024-01-02 NOTE — Patient Instructions (Addendum)

## 2024-01-02 NOTE — Progress Notes (Signed)
 Follow-Up Visit   Subjective  Norman Castillo is a 73 y.o. male who presents for the following: Skin Cancer Screening and Full Body Skin Exam, hx of precancers.   The patient presents for Total-Body Skin Exam (TBSE) for skin cancer screening and mole check. The patient has spots, moles and lesions to be evaluated, some may be new or changing and the patient may have concern these could be cancer.  The following portions of the chart were reviewed this encounter and updated as appropriate: medications, allergies, medical history  Review of Systems:  No other skin or systemic complaints except as noted in HPI or Assessment and Plan.  Objective  Well appearing patient in no apparent distress; mood and affect are within normal limits.  A full examination was performed including scalp, head, eyes, ears, nose, lips, neck, chest, axillae, abdomen, back, buttocks, bilateral upper extremities, bilateral lower extremities, hands, feet, fingers, toes, fingernails, and toenails. All findings within normal limits unless otherwise noted below.   Relevant physical exam findings are noted in the Assessment and Plan.  face x 12 (12) Erythematous thin papules/macules with gritty scale.  left cheek x 1, left wrist x 1 (2) Stuck-on, waxy, tan-brown papule or plaque --Discussed benign etiology and prognosis.   Assessment & Plan   SKIN CANCER SCREENING PERFORMED TODAY.  ACTINIC DAMAGE - Chronic condition, secondary to cumulative UV/sun exposure - diffuse scaly erythematous macules with underlying dyspigmentation - Recommend daily broad spectrum sunscreen SPF 30+ to sun-exposed areas, reapply every 2 hours as needed.  - Staying in the shade or wearing long sleeves, sun glasses (UVA+UVB protection) and wide brim hats (4-inch brim around the entire circumference of the hat) are also recommended for sun protection.  - Call for new or changing lesions.  LENTIGINES, SEBORRHEIC KERATOSES, HEMANGIOMAS -  Benign normal skin lesions - Benign-appearing - Call for any changes  MELANOCYTIC NEVI - Tan-brown and/or pink-flesh-colored symmetric macules and papules - Benign appearing on exam today - Observation - Call clinic for new or changing moles - Recommend daily use of broad spectrum spf 30+ sunscreen to sun-exposed areas.   AK (ACTINIC KERATOSIS) (12) face x 12 (12) Actinic keratoses are precancerous spots that appear secondary to cumulative UV radiation exposure/sun exposure over time. They are chronic with expected duration over 1 year. A portion of actinic keratoses will progress to squamous cell carcinoma of the skin. It is not possible to reliably predict which spots will progress to skin cancer and so treatment is recommended to prevent development of skin cancer.  Recommend daily broad spectrum sunscreen SPF 30+ to sun-exposed areas, reapply every 2 hours as needed.  Recommend staying in the shade or wearing long sleeves, sun glasses (UVA+UVB protection) and wide brim hats (4-inch brim around the entire circumference of the hat). Call for new or changing lesions.  Destruction of lesion - face x 12 (12) Complexity: simple   Destruction method: cryotherapy   Informed consent: discussed and consent obtained   Timeout:  patient name, date of birth, surgical site, and procedure verified Lesion destroyed using liquid nitrogen: Yes   Region frozen until ice ball extended beyond lesion: Yes   Outcome: patient tolerated procedure well with no complications   Post-procedure details: wound care instructions given   INFLAMED SEBORRHEIC KERATOSIS (2) left cheek x 1, left wrist x 1 (2) Symptomatic, irritating, patient would like treated.  Destruction of lesion - left cheek x 1, left wrist x 1 (2) Complexity: simple   Destruction  method: cryotherapy   Informed consent: discussed and consent obtained   Timeout:  patient name, date of birth, surgical site, and procedure verified Lesion  destroyed using liquid nitrogen: Yes   Region frozen until ice ball extended beyond lesion: Yes   Outcome: patient tolerated procedure well with no complications   Post-procedure details: wound care instructions given   Additional details:  Prior to procedure, discussed risks of blister formation, small wound, skin dyspigmentation, or rare scar following cryotherapy. Recommend Vaseline ointment to treated areas while healing.    Return in about 1 year (around 01/01/2025) for TBSE, hx of AKs .  IAngelique Holm, CMA, am acting as scribe for Armida Sans, MD .   Documentation: I have reviewed the above documentation for accuracy and completeness, and I agree with the above.  Armida Sans, MD

## 2024-02-22 ENCOUNTER — Other Ambulatory Visit: Payer: Self-pay | Admitting: Surgery

## 2024-02-27 ENCOUNTER — Inpatient Hospital Stay: Admission: RE | Admit: 2024-02-27 | Source: Ambulatory Visit

## 2024-03-07 ENCOUNTER — Ambulatory Visit: Admit: 2024-03-07 | Admitting: Surgery

## 2024-03-07 SURGERY — ARTHROPLASTY, KNEE, TOTAL
Anesthesia: Choice | Site: Knee | Laterality: Right

## 2024-03-10 LAB — LIPID PANEL
Cholesterol: 229 — AB (ref 0–200)
HDL: 62 (ref 35–70)
LDL Cholesterol: 148
Triglycerides: 108 (ref 40–160)

## 2024-03-10 LAB — COMPREHENSIVE METABOLIC PANEL WITH GFR
Albumin: 4.7 (ref 3.5–5.0)
Calcium: 9.8 (ref 8.7–10.7)
eGFR: 73

## 2024-03-10 LAB — TSH: TSH: 2.71 (ref 0.41–5.90)

## 2024-03-10 LAB — PROTEIN / CREATININE RATIO, URINE
Albumin, U: 530.7
Creatinine, Urine: 167.3

## 2024-03-10 LAB — BASIC METABOLIC PANEL WITH GFR
BUN: 14 (ref 4–21)
CO2: 20 (ref 13–22)
Chloride: 102 (ref 99–108)
Creatinine: 1.1 (ref 0.6–1.3)
Glucose: 93
Potassium: 4.4 meq/L (ref 3.5–5.1)
Sodium: 140 (ref 137–147)

## 2024-03-10 LAB — HEPATIC FUNCTION PANEL
ALT: 22 U/L (ref 10–40)
AST: 29 (ref 14–40)
Alkaline Phosphatase: 94 (ref 25–125)
Bilirubin, Total: 0.6

## 2024-03-10 LAB — HEMOGLOBIN A1C: Hemoglobin A1C: 6.2

## 2024-03-10 LAB — MICROALBUMIN / CREATININE URINE RATIO: Microalb Creat Ratio: 317

## 2024-08-17 ENCOUNTER — Encounter: Payer: Self-pay | Admitting: Family Medicine

## 2024-08-17 DIAGNOSIS — K219 Gastro-esophageal reflux disease without esophagitis: Secondary | ICD-10-CM | POA: Insufficient documentation

## 2024-08-17 DIAGNOSIS — I10 Essential (primary) hypertension: Secondary | ICD-10-CM | POA: Insufficient documentation

## 2024-08-17 DIAGNOSIS — E782 Mixed hyperlipidemia: Secondary | ICD-10-CM | POA: Insufficient documentation

## 2024-08-17 DIAGNOSIS — E119 Type 2 diabetes mellitus without complications: Secondary | ICD-10-CM | POA: Insufficient documentation

## 2024-08-17 NOTE — Progress Notes (Unsigned)
 Norman Goshorn T. [name], MD, CAQ Sports Medicine [facility] at [facility] [address] [place], [address]  Phone: [phone]  FAX: [phone]  Norman Castillo - 73 y.o. male  MRN [id number]  Date of Birth: 1951-02-03  Date: 08/19/2024  PCP: Norman Vannie PARAS, MD  Referral: Norman Vannie PARAS, MD  No chief complaint on file.  Subjective:   Norman Castillo is a 73 y.o. very pleasant male patient with There is no height or weight on file to calculate BMI. who presents with the following:  Discussed the use of AI scribe software for clinical note transcription with the patient, who gave verbal consent to proceed.  Patient presents to me as a new patient and as a new patient to [place] primary care.  He had previously been seeing Dr. Christi.  History is significant for controlled type 2 diabetes on metformin.  Hyperlipidemia, pravastatin 40 mg.  Gemfibrozil 600 mg.  History of hypertension on metoprolol XL 50 mg.  History of alcohol abuse. History of Present Illness     Review of Systems is noted in the HPI, as appropriate  Objective:   There were no vitals taken for this visit.  GEN: No acute distress; alert,appropriate. PULM: Breathing comfortably in no respiratory distress PSYCH: Normally interactive.   Laboratory and Imaging Data:  Assessment and Plan:   No diagnosis found. Assessment & Plan   Medication Management during today's office visit: No orders of the defined types were placed in this encounter.  There are no discontinued medications.  Orders placed today for conditions managed today: No orders of the defined types were placed in this encounter.   Disposition: No follow-ups on file.  Dragon [employer] One speech-to-text software was used for transcription in this dictation.  Possible transcriptional errors can occur using Animal nutritionist.   Signed,  Norman Castillo. Norman Tally, MD   Outpatient Encounter Medications as  of 08/19/2024  Medication Sig   allopurinol (ZYLOPRIM) 100 MG tablet Take 100 mg by mouth daily.   ALPRAZolam (XANAX) 0.5 MG tablet Take 0.5 mg by mouth daily as needed.   Ascorbic Acid (VITAMIN C PO) Take by mouth daily.   aspirin 81 MG tablet Take 81 mg by mouth daily.   Fluticasone Propionate (FLONASE ALLERGY RELIEF NA) Place into the nose daily as needed.   gemfibrozil (LOPID) 600 MG tablet Take 600 mg by mouth 2 (two) times daily before a meal.   hydrocortisone  2.5 % cream Apply topically 2 (two) times daily as needed (Rash). Apply to skin qd x 3 days prn flares   ketoconazole  (NIZORAL ) 2 % cream Apply twice daily to feet until 1 week post clearance   meloxicam (MOBIC) 15 MG tablet Take 15 mg by mouth daily.   metFORMIN (GLUCOPHAGE) 500 MG tablet Take 500 mg by mouth 2 (two) times daily with a meal.   metoprolol succinate (TOPROL-XL) 50 MG 24 hr tablet Take 50 mg by mouth 2 (two) times daily. Take with or immediately following a meal.   mometasone  (ELOCON ) 0.1 % cream Apply Monday, Wednesday, Friday at bedtime as needed for rash behind ear.   Multiple Vitamin (MULTIVITAMIN) tablet Take 1 tablet by mouth daily.   omeprazole (PRILOSEC) 40 MG capsule Take 40 mg by mouth daily.   pravastatin (PRAVACHOL) 40 MG tablet Take 40 mg by mouth 2 (two) times daily.   terbinafine  (LAMISIL ) 250 MG tablet Take 1 tablet (250 mg total) by mouth daily.   No facility-administered  encounter medications on file as of 08/19/2024.

## 2024-08-19 ENCOUNTER — Encounter: Payer: Self-pay | Admitting: Family Medicine

## 2024-08-19 ENCOUNTER — Ambulatory Visit: Admitting: Family Medicine

## 2024-08-19 VITALS — BP 160/70 | HR 58 | Temp 97.7°F | Ht 63.5 in | Wt 197.5 lb

## 2024-08-19 DIAGNOSIS — E782 Mixed hyperlipidemia: Secondary | ICD-10-CM | POA: Diagnosis not present

## 2024-08-19 DIAGNOSIS — I10 Essential (primary) hypertension: Secondary | ICD-10-CM | POA: Diagnosis not present

## 2024-08-19 DIAGNOSIS — Z7984 Long term (current) use of oral hypoglycemic drugs: Secondary | ICD-10-CM

## 2024-08-19 DIAGNOSIS — E119 Type 2 diabetes mellitus without complications: Secondary | ICD-10-CM | POA: Diagnosis not present

## 2024-08-19 DIAGNOSIS — K219 Gastro-esophageal reflux disease without esophagitis: Secondary | ICD-10-CM

## 2024-08-19 DIAGNOSIS — F1010 Alcohol abuse, uncomplicated: Secondary | ICD-10-CM

## 2024-08-19 MED ORDER — METFORMIN HCL 500 MG PO TABS: 500.0000 mg | ORAL_TABLET | Freq: Two times a day (BID) | ORAL | 1 refills | Status: AC

## 2024-08-19 MED ORDER — METOPROLOL TARTRATE 50 MG PO TABS: 50.0000 mg | ORAL_TABLET | Freq: Two times a day (BID) | ORAL | 1 refills | Status: AC

## 2024-08-19 MED ORDER — ALPRAZOLAM 0.5 MG PO TABS: 0.5000 mg | ORAL_TABLET | Freq: Three times a day (TID) | ORAL | 2 refills | Status: AC | PRN

## 2024-08-19 MED ORDER — LOSARTAN POTASSIUM 50 MG PO TABS: 50.0000 mg | ORAL_TABLET | Freq: Every day | ORAL | 3 refills | Status: AC

## 2024-08-19 NOTE — Patient Instructions (Addendum)
 Stop Glipizide  Stop Gemfibrozil  Drop Metoprolol to 50 mg twice a day  Start Losartan 50 mg once a day  Start Metformin 500 mg twice a day

## 2024-09-11 LAB — HEMOGLOBIN A1C: Hemoglobin A1C: 6.3

## 2024-09-17 NOTE — Progress Notes (Deleted)
 Goldie Dimmer T. Brad Mcgaughy, MD, CAQ Sports Medicine [facility] at [facility] [address] [place], [address]  Phone: [phone]  FAX: [phone]  [name] - 73 y.o. male  MRN [id number]  Date of Birth: 11/06/1951  Date: 09/19/2024  PCP: Watt Mirza, MD  Referral: Christi Vannie PARAS, MD  No chief complaint on file.  Subjective:   Norman Castillo is a 73 y.o. very pleasant male patient with There is no height or weight on file to calculate BMI. who presents with the following:  Discussed the use of AI scribe software for clinical note transcription with the patient, who gave verbal consent to proceed.  The patient is a recent new patient to me.  He is here for hospital follow-up from [facility].  Admit date: 09/13/2024 Discharge Date: 09/14/2024   Status post total knee arthroplasty, recommendation was to recheck magnesium. History of Present Illness     Review of Systems is noted in the HPI, as appropriate  Objective:   There were no vitals taken for this visit.  GEN: No acute distress; alert,appropriate. PULM: Breathing comfortably in no respiratory distress PSYCH: Normally interactive.   Laboratory and Imaging Data:  Assessment and Plan:   No diagnosis found. Assessment & Plan   Medication Management during today's office visit: No orders of the defined types were placed in this encounter.  There are no discontinued medications.  Orders placed today for conditions managed today: No orders of the defined types were placed in this encounter.   Disposition: No follow-ups on file.  Dragon Medical One speech-to-text software was used for transcription in this dictation.  Possible transcriptional errors can occur using Animal nutritionist.   Signed,  Mirza DASEN. Taiga Lupinacci, MD   Outpatient Encounter Medications as of 09/19/2024  Medication Sig   allopurinol (ZYLOPRIM) 100 MG tablet Take 100 mg by mouth daily.   ALPRAZolam (XANAX)  0.5 MG tablet Take 1 tablet (0.5 mg total) by mouth 3 (three) times daily as needed for anxiety.   amoxicillin (AMOXIL) 500 MG capsule Take 2,000 mg by mouth once. Before Dental Procedures   Ascorbic Acid (VITAMIN C PO) Take by mouth daily.   fish oil-omega-3 fatty acids 1000 MG capsule Take 1 g by mouth daily.   Fluticasone Propionate (FLONASE ALLERGY RELIEF NA) Place into the nose daily as needed.   hydrocortisone  2.5 % cream Apply topically 2 (two) times daily as needed (Rash). Apply to skin qd x 3 days prn flares   ketoconazole  (NIZORAL ) 2 % cream Apply twice daily to feet until 1 week post clearance   losartan (COZAAR) 50 MG tablet Take 1 tablet (50 mg total) by mouth daily.   meloxicam (MOBIC) 15 MG tablet Take 15 mg by mouth daily.   metFORMIN (GLUCOPHAGE) 500 MG tablet Take 1 tablet (500 mg total) by mouth 2 (two) times daily with a meal.   metoprolol tartrate (LOPRESSOR) 50 MG tablet Take 1 tablet (50 mg total) by mouth 2 (two) times daily.   mometasone  (ELOCON ) 0.1 % cream Apply Monday, Wednesday, Friday at bedtime as needed for rash behind ear.   Multiple Vitamin (MULTIVITAMIN) tablet Take 1 tablet by mouth daily.   omeprazole (PRILOSEC) 40 MG capsule Take 40 mg by mouth daily.   pravastatin (PRAVACHOL) 80 MG tablet Take 80 mg by mouth daily.   predniSONE (DELTASONE) 10 MG tablet Take 5 mg by mouth daily as needed.   REPATHA SURECLICK 140 MG/ML SOAJ Inject 140 mg  into the skin every 14 (fourteen) days.   No facility-administered encounter medications on file as of 09/19/2024.

## 2024-09-19 ENCOUNTER — Inpatient Hospital Stay: Admitting: Family Medicine

## 2024-09-19 DIAGNOSIS — Z96651 Presence of right artificial knee joint: Secondary | ICD-10-CM

## 2024-11-19 ENCOUNTER — Encounter: Payer: Self-pay | Admitting: Family Medicine

## 2024-11-19 DIAGNOSIS — I350 Nonrheumatic aortic (valve) stenosis: Secondary | ICD-10-CM | POA: Insufficient documentation

## 2024-11-19 DIAGNOSIS — I2510 Atherosclerotic heart disease of native coronary artery without angina pectoris: Secondary | ICD-10-CM | POA: Insufficient documentation

## 2024-11-19 NOTE — Progress Notes (Unsigned)
 "    Roman Dubuc T. Cheskel Silverio, MD, CAQ Sports Medicine [facility] at [facility] [address] [place], [address]  Phone: [phone]  FAX: [phone]  Norman Castillo - 74 y.o. male  MRN [id number]  Date of Birth: 01/09/1951  Date: 11/20/2024  PCP: Watt Mirza, MD  Referral: Watt Mirza, MD  No chief complaint on file.  Subjective:   Norman Castillo is a 74 y.o. very pleasant male patient with There is no height or weight on file to calculate BMI. who presents with the following:  Discussed the use of AI scribe software for clinical note transcription with the patient, who gave verbal consent to proceed.  Norman Castillo is a very pleasant gentleman who is here for diabetes and hypertension follow-up.  In the interval time period, he has had a right total knee arthroplasty.  He has also seen cardiology and found to have some aortic valve stenosis and aortic regurgitation.  Diabetes Mellitus: Tolerating Medications: yes Compliance with diet: fair, There is no height or weight on file to calculate BMI. Exercise: minimal / intermittent Avg blood sugars at home: not checking Foot problems: none Hypoglycemia: none No nausea, vomitting, blurred vision, polyuria.  Lab Results  Component Value Date   HGBA1C 6.2 03/10/2024   Lab Results  Component Value Date   LDLCALC 148 03/10/2024   CREATININE 1.1 03/10/2024    Wt Readings from Last 3 Encounters:  08/19/24 197 lb 8 oz (89.6 kg)  02/08/21 189 lb (85.7 kg)  12/30/20 195 lb (88.5 kg)    HTN: Tolerating all medications without side effects Stable and at goal No CP, no sob. No HA.  BP Readings from Last 3 Encounters:  08/19/24 (!) 160/70  06/26/23 (!) 144/72  02/08/21 (!) 170/64    Basic Metabolic Panel:    Component Value Date/Time   NA 140 03/10/2024 0000   K 4.4 03/10/2024 0000   CL 102 03/10/2024 0000   CO2 20 03/10/2024 0000   BUN 14 03/10/2024 0000   CREATININE 1.1 03/10/2024 0000    CREATININE 1.10 10/21/2020 1233   GLUCOSE 188 (H) 10/21/2020 1233   CALCIUM 9.8 03/10/2024 0000    History of Present Illness     Review of Systems is noted in the HPI, as appropriate  Objective:   There were no vitals taken for this visit.  GEN: No acute distress; alert,appropriate. PULM: Breathing comfortably in no respiratory distress PSYCH: Normally interactive.   Laboratory and Imaging Data:  Assessment and Plan:   No diagnosis found. Assessment & Plan   Medication Management during today's office visit: No orders of the defined types were placed in this encounter.  There are no discontinued medications.  Orders placed today for conditions managed today: No orders of the defined types were placed in this encounter.   Disposition: No follow-ups on file.  Dragon Medical One speech-to-text software was used for transcription in this dictation.  Possible transcriptional errors can occur using Animal nutritionist.   Signed,  Mirza DASEN. Dossie Swor, MD   Outpatient Encounter Medications as of 11/20/2024  Medication Sig   allopurinol (ZYLOPRIM) 100 MG tablet Take 100 mg by mouth daily.   ALPRAZolam  (XANAX ) 0.5 MG tablet Take 1 tablet (0.5 mg total) by mouth 3 (three) times daily as needed for anxiety.   amoxicillin (AMOXIL) 500 MG capsule Take 2,000 mg by mouth once. Before Dental Procedures   Ascorbic Acid (VITAMIN C PO) Take by mouth daily.   fish oil-omega-3 fatty  acids 1000 MG capsule Take 1 g by mouth daily.   Fluticasone Propionate (FLONASE ALLERGY RELIEF NA) Place into the nose daily as needed.   hydrocortisone  2.5 % cream Apply topically 2 (two) times daily as needed (Rash). Apply to skin qd x 3 days prn flares   ketoconazole  (NIZORAL ) 2 % cream Apply twice daily to feet until 1 week post clearance   losartan  (COZAAR ) 50 MG tablet Take 1 tablet (50 mg total) by mouth daily.   meloxicam (MOBIC) 15 MG tablet Take 15 mg by mouth daily.   metFORMIN  (GLUCOPHAGE ) 500  MG tablet Take 1 tablet (500 mg total) by mouth 2 (two) times daily with a meal.   metoprolol  tartrate (LOPRESSOR ) 50 MG tablet Take 1 tablet (50 mg total) by mouth 2 (two) times daily.   mometasone  (ELOCON ) 0.1 % cream Apply Monday, Wednesday, Friday at bedtime as needed for rash behind ear.   Multiple Vitamin (MULTIVITAMIN) tablet Take 1 tablet by mouth daily.   omeprazole (PRILOSEC) 40 MG capsule Take 40 mg by mouth daily.   pravastatin (PRAVACHOL) 80 MG tablet Take 80 mg by mouth daily.   predniSONE (DELTASONE) 10 MG tablet Take 5 mg by mouth daily as needed.   REPATHA SURECLICK 140 MG/ML SOAJ Inject 140 mg into the skin every 14 (fourteen) days.   No facility-administered encounter medications on file as of 11/20/2024.   "

## 2024-11-20 ENCOUNTER — Ambulatory Visit: Admitting: Family Medicine

## 2024-11-20 DIAGNOSIS — I10 Essential (primary) hypertension: Secondary | ICD-10-CM

## 2024-11-20 DIAGNOSIS — E119 Type 2 diabetes mellitus without complications: Secondary | ICD-10-CM

## 2024-11-20 DIAGNOSIS — I2510 Atherosclerotic heart disease of native coronary artery without angina pectoris: Secondary | ICD-10-CM

## 2024-11-20 DIAGNOSIS — I350 Nonrheumatic aortic (valve) stenosis: Secondary | ICD-10-CM

## 2024-11-26 NOTE — Progress Notes (Unsigned)
 "    Norman Castillo. Norman Talmadge, MD, CAQ Sports Medicine [facility] at [facility] [address] [place], [address]  Phone: [phone]  FAX: [phone]  Norman Castillo - 74 y.o. male  MRN [id number]  Date of Birth: [birth year]  Date: 11/27/2024  PCP: Watt Mirza, MD  Referral: Watt Mirza, MD  No chief complaint on file.  Subjective:   Norman Castillo is a 74 y.o. very pleasant male patient with There is no height or weight on file to calculate BMI. who presents with the following:  Discussed the use of AI scribe software for clinical note transcription with the patient, who gave verbal consent to proceed.  Norman Castillo is a very pleasant gentleman who is here for diabetes and hypertension follow-up.  In the interval time period, he has had a right total knee arthroplasty.  He has also seen cardiology and found to have some aortic valve stenosis and aortic regurgitation.  Diabetes Mellitus: Tolerating Medications: yes Compliance with diet: fair, There is no height or weight on file to calculate BMI. Exercise: minimal / intermittent Avg blood sugars at home: not checking Foot problems: none Hypoglycemia: none No nausea, vomitting, blurred vision, polyuria.  Lab Results  Component Value Date   HGBA1C 6.2 03/10/2024   Lab Results  Component Value Date   LDLCALC 148 03/10/2024   CREATININE 1.1 03/10/2024    Wt Readings from Last 3 Encounters:  08/19/24 197 lb 8 oz (89.6 kg)  02/08/21 189 lb (85.7 kg)  12/30/20 195 lb (88.5 kg)    HTN: Tolerating all medications without side effects Stable and at goal No CP, no sob. No HA.  BP Readings from Last 3 Encounters:  08/19/24 (!) 160/70  06/26/23 (!) 144/72  02/08/21 (!) 170/64    Basic Metabolic Panel:    Component Value Date/Time   NA 140 03/10/2024 0000   K 4.4 03/10/2024 0000   CL 102 03/10/2024 0000   CO2 20 03/10/2024 0000   BUN 14 03/10/2024 0000   CREATININE 1.1 03/10/2024 0000    CREATININE 1.10 10/21/2020 1233   GLUCOSE 188 (H) 10/21/2020 1233   CALCIUM 9.8 03/10/2024 0000    History of Present Illness     Review of Systems is noted in the HPI, as appropriate  Objective:   There were no vitals taken for this visit.  GEN: No acute distress; alert,appropriate. PULM: Breathing comfortably in no respiratory distress PSYCH: Normally interactive.   Laboratory and Imaging Data:  Assessment and Plan:   No diagnosis found. Assessment & Plan   Medication Management during today's office visit: No orders of the defined types were placed in this encounter.  There are no discontinued medications.  Orders placed today for conditions managed today: No orders of the defined types were placed in this encounter.   Disposition: No follow-ups on file.  Dragon [employer] One speech-to-text software was used for transcription in this dictation.  Possible transcriptional errors can occur using Animal nutritionist.   Signed,  Norman Castillo. Norman Kneisley, MD   Outpatient Encounter Medications as of 11/27/2024  Medication Sig   allopurinol (ZYLOPRIM) 100 MG tablet Take 100 mg by mouth daily.   ALPRAZolam  (XANAX ) 0.5 MG tablet Take 1 tablet (0.5 mg total) by mouth 3 (three) times daily as needed for anxiety.   amoxicillin (AMOXIL) 500 MG capsule Take 2,000 mg by mouth once. Before Dental Procedures   Ascorbic Acid (VITAMIN C PO) Take by mouth daily.   fish oil-omega-3 fatty  acids 1000 MG capsule Take 1 g by mouth daily.   Fluticasone Propionate (FLONASE ALLERGY RELIEF NA) Place into the nose daily as needed.   hydrocortisone  2.5 % cream Apply topically 2 (two) times daily as needed (Rash). Apply to skin qd x 3 days prn flares   ketoconazole  (NIZORAL ) 2 % cream Apply twice daily to feet until 1 week post clearance   losartan  (COZAAR ) 50 MG tablet Take 1 tablet (50 mg total) by mouth daily.   meloxicam (MOBIC) 15 MG tablet Take 15 mg by mouth daily.   metFORMIN  (GLUCOPHAGE ) 500  MG tablet Take 1 tablet (500 mg total) by mouth 2 (two) times daily with a meal.   metoprolol  tartrate (LOPRESSOR ) 50 MG tablet Take 1 tablet (50 mg total) by mouth 2 (two) times daily.   mometasone  (ELOCON ) 0.1 % cream Apply Monday, Wednesday, Friday at bedtime as needed for rash behind ear.   Multiple Vitamin (MULTIVITAMIN) tablet Take 1 tablet by mouth daily.   omeprazole (PRILOSEC) 40 MG capsule Take 40 mg by mouth daily.   pravastatin (PRAVACHOL) 80 MG tablet Take 80 mg by mouth daily.   predniSONE (DELTASONE) 10 MG tablet Take 5 mg by mouth daily as needed.   REPATHA SURECLICK 140 MG/ML SOAJ Inject 140 mg into the skin every 14 (fourteen) days.   No facility-administered encounter medications on file as of 11/27/2024.   "

## 2024-11-27 ENCOUNTER — Ambulatory Visit: Admitting: Family Medicine

## 2024-11-27 ENCOUNTER — Encounter: Payer: Self-pay | Admitting: Family Medicine

## 2024-11-27 VITALS — BP 150/70 | HR 75 | Temp 97.1°F | Ht 63.5 in | Wt 205.5 lb

## 2024-11-27 DIAGNOSIS — I10 Essential (primary) hypertension: Secondary | ICD-10-CM | POA: Diagnosis not present

## 2024-11-27 DIAGNOSIS — M2550 Pain in unspecified joint: Secondary | ICD-10-CM

## 2024-11-27 DIAGNOSIS — Z23 Encounter for immunization: Secondary | ICD-10-CM

## 2024-11-27 DIAGNOSIS — E119 Type 2 diabetes mellitus without complications: Secondary | ICD-10-CM | POA: Diagnosis not present

## 2024-11-27 DIAGNOSIS — D508 Other iron deficiency anemias: Secondary | ICD-10-CM | POA: Diagnosis not present

## 2024-11-27 DIAGNOSIS — Z7984 Long term (current) use of oral hypoglycemic drugs: Secondary | ICD-10-CM

## 2024-11-27 DIAGNOSIS — Z125 Encounter for screening for malignant neoplasm of prostate: Secondary | ICD-10-CM | POA: Diagnosis not present

## 2024-11-27 NOTE — Patient Instructions (Signed)
 Check your blood pressure every day for the next 2 weeks.  Write the BP numbers down.

## 2024-11-28 ENCOUNTER — Encounter: Payer: Self-pay | Admitting: Family Medicine

## 2024-11-28 ENCOUNTER — Ambulatory Visit: Payer: Self-pay | Admitting: Family Medicine

## 2024-11-28 LAB — CBC WITH DIFFERENTIAL/PLATELET
Basophils Absolute: 0.1 K/uL (ref 0.0–0.1)
Basophils Relative: 2.1 % (ref 0.0–3.0)
Eosinophils Absolute: 0.1 K/uL (ref 0.0–0.7)
Eosinophils Relative: 2.4 % (ref 0.0–5.0)
HCT: 35.6 % — ABNORMAL LOW (ref 39.0–52.0)
Hemoglobin: 11.7 g/dL — ABNORMAL LOW (ref 13.0–17.0)
Lymphocytes Relative: 31.2 % (ref 12.0–46.0)
Lymphs Abs: 1.9 K/uL (ref 0.7–4.0)
MCHC: 33 g/dL (ref 30.0–36.0)
MCV: 86.8 fl (ref 78.0–100.0)
Monocytes Absolute: 0.5 K/uL (ref 0.1–1.0)
Monocytes Relative: 9.1 % (ref 3.0–12.0)
Neutro Abs: 3.3 K/uL (ref 1.4–7.7)
Neutrophils Relative %: 55.2 % (ref 43.0–77.0)
Platelets: 240 K/uL (ref 150.0–400.0)
RBC: 4.1 Mil/uL — ABNORMAL LOW (ref 4.22–5.81)
RDW: 14.7 % (ref 11.5–15.5)
WBC: 6 K/uL (ref 4.0–10.5)

## 2024-11-28 LAB — IBC + FERRITIN
Ferritin: 44.3 ng/mL (ref 22.0–322.0)
Iron: 52 ug/dL (ref 42–165)
Saturation Ratios: 11.9 % — ABNORMAL LOW (ref 20.0–50.0)
TIBC: 436.8 ug/dL (ref 250.0–450.0)
Transferrin: 312 mg/dL (ref 212.0–360.0)

## 2024-11-28 LAB — HIGH SENSITIVITY CRP: CRP, High Sensitivity: 10.44 mg/L — ABNORMAL HIGH (ref 0.200–5.000)

## 2024-11-28 LAB — PSA, MEDICARE: PSA: 0.71 ng/mL (ref 0.10–4.00)

## 2024-11-28 LAB — SEDIMENTATION RATE: Sed Rate: 2 mm/h (ref 0–20)

## 2024-12-01 LAB — ANA SCREEN,IFA,REFLEX TITER/PATTERN,REFLEX MPLX 11 AB CASCADE
Anti Nuclear Antibody (ANA): NEGATIVE
Cyclic Citrullin Peptide Ab: <16 U
MUTATED CITRULLINATED VIMENTIN (MCV) AB: <20 U/mL
Rheumatoid fact SerPl-aCnc: <10 [IU]/mL

## 2024-12-09 ENCOUNTER — Ambulatory Visit

## 2025-01-02 ENCOUNTER — Ambulatory Visit: Admitting: Dermatology

## 2025-01-02 ENCOUNTER — Ambulatory Visit: Payer: Medicare Other | Admitting: Dermatology

## 2025-01-03 ENCOUNTER — Ambulatory Visit

## 2025-08-20 ENCOUNTER — Other Ambulatory Visit

## 2025-08-27 ENCOUNTER — Encounter: Admitting: Family Medicine
